# Patient Record
Sex: Female | Born: 1995 | Race: White | Hispanic: No | Marital: Married | Smoking: Never smoker
Health system: Southern US, Community
[De-identification: ages and names within clinical notes are randomized; demographics above are authoritative.]

## PROBLEM LIST (undated history)

## (undated) ENCOUNTER — Inpatient Hospital Stay (HOSPITAL_COMMUNITY): Payer: Self-pay

## (undated) DIAGNOSIS — N946 Dysmenorrhea, unspecified: Secondary | ICD-10-CM

## (undated) DIAGNOSIS — Z5189 Encounter for other specified aftercare: Secondary | ICD-10-CM

## (undated) DIAGNOSIS — J45909 Unspecified asthma, uncomplicated: Secondary | ICD-10-CM

## (undated) DIAGNOSIS — D649 Anemia, unspecified: Secondary | ICD-10-CM

## (undated) HISTORY — DX: Anemia, unspecified: D64.9

## (undated) HISTORY — PX: OTHER SURGICAL HISTORY: SHX169

## (undated) HISTORY — DX: Dysmenorrhea, unspecified: N94.6

## (undated) HISTORY — DX: Encounter for other specified aftercare: Z51.89

---
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
## 2015-03-16 ENCOUNTER — Encounter (HOSPITAL_COMMUNITY): Payer: Self-pay | Admitting: Emergency Medicine

## 2015-03-16 ENCOUNTER — Emergency Department (HOSPITAL_COMMUNITY)
Admission: EM | Admit: 2015-03-16 | Discharge: 2015-03-16 | Disposition: A | Discharge status: Home / Self Care | Payer: Medicaid Other | Attending: Emergency Medicine | Admitting: Emergency Medicine

## 2015-03-16 ENCOUNTER — Emergency Department (HOSPITAL_COMMUNITY): Payer: Medicaid Other

## 2015-03-16 DIAGNOSIS — Y9389 Activity, other specified: Secondary | ICD-10-CM | POA: Insufficient documentation

## 2015-03-16 DIAGNOSIS — Z793 Long term (current) use of hormonal contraceptives: Secondary | ICD-10-CM | POA: Diagnosis not present

## 2015-03-16 DIAGNOSIS — S60222A Contusion of left hand, initial encounter: Secondary | ICD-10-CM | POA: Insufficient documentation

## 2015-03-16 DIAGNOSIS — Y92830 Public park as the place of occurrence of the external cause: Secondary | ICD-10-CM | POA: Insufficient documentation

## 2015-03-16 DIAGNOSIS — Y998 Other external cause status: Secondary | ICD-10-CM | POA: Diagnosis not present

## 2015-03-16 DIAGNOSIS — S6992XA Unspecified injury of left wrist, hand and finger(s), initial encounter: Secondary | ICD-10-CM

## 2015-03-16 DIAGNOSIS — Z791 Long term (current) use of non-steroidal anti-inflammatories (NSAID): Secondary | ICD-10-CM | POA: Diagnosis not present

## 2015-03-16 DIAGNOSIS — W01198A Fall on same level from slipping, tripping and stumbling with subsequent striking against other object, initial encounter: Secondary | ICD-10-CM | POA: Insufficient documentation

## 2015-03-16 NOTE — ED Notes (Signed)
Patient fell on to her left hand yesterday  - patient states that when there is pressure applied to her left hand she had a lot of pain to the area. No noted deformity

## 2015-03-16 NOTE — ED Notes (Signed)
Patient transported to X-ray 

## 2015-03-16 NOTE — ED Notes (Signed)
Patient d/c';d self care.  F/U and home care reviewed.  Patient verbalized understanding.

## 2015-03-16 NOTE — ED Provider Notes (Signed)
CSN: [id number]     Arrival date & time 03/16/15  2024 History  By signing my name below, I, Emmanuella Mensah, attest that this documentation has been prepared under the direction and in the presence of Elpidio Anis, PA-C. Electronically Signed: Angelene Giovanni, ED Scribe. 03/16/2015. 9:12 PM.    Chief Complaint  Patient presents with  . Hand Injury    The history is provided by the patient. No language interpreter was used.   HPI Comments: Karen Yang is a 20 y.o. female who presents to the Emergency Department complaining of ongoing constant moderate pain to the palmar aspect to the left hand s/p fall that occurred yesterday. She reports associated mild discoloration to that area. She explains that she was helping her younger brother at the [facility] park yesterday when she fell, hitting her bilateral palm on the ground to break her fall. No alleviating factors noted. Pt has not tried any medications PTA. No lacerations, swelling, fever, or chills.    History reviewed. No pertinent past medical history. History reviewed. No pertinent past surgical history. History reviewed. No pertinent family history. Social History  Substance Use Topics  . Smoking status: Never Smoker   . Smokeless tobacco: None  . Alcohol Use: No   OB History    No data available     Review of Systems  Constitutional: Negative for fever and chills.  Musculoskeletal: Positive for arthralgias.  Skin: Positive for color change (mild). Negative for wound.      Allergies  Review of patient's allergies indicates no known allergies.  Home Medications   Prior to Admission medications   Medication Sig Start Date End Date Taking? Authorizing Provider  ibuprofen (ADVIL,MOTRIN) 800 MG tablet Take 1 tablet (800 mg total) by mouth 3 (three) times daily. 10/19/14   Emilia Beck, PA-C  Norgestimate-Ethinyl Estradiol Triphasic (TRINESSA, 28,) 0.18/0.215/0.25 MG-35 MCG tablet Take 1 tablet by mouth daily.     Historical Provider, MD   BP 113/52 mmHg  Pulse 75  Temp(Src) 98.2 F (36.8 C) (Oral)  Resp 13  Ht  (1.6 m)  Wt 105 lb (47.628 kg)  BMI 18.60 kg/m2  SpO2 100%  LMP 02/19/2015 Physical Exam  Constitutional: She is oriented to person, place, and time. She appears well-developed and well-nourished.  HENT:  Head: Normocephalic and atraumatic.  Cardiovascular: Normal rate.   Pulmonary/Chest: Effort normal.  Abdominal: She exhibits no distension.  Musculoskeletal:  Right hand without any bony deformity. Thenar ecchymosis without significant swelling. Tenderness localized to thenar area. FROM all joints of right hand and wrist.   Neurological: She is alert and oriented to person, place, and time.  Skin: Skin is warm and dry.  Psychiatric: She has a normal mood and affect.  Nursing note and vitals reviewed.   ED Course  Procedures (including critical care time) DIAGNOSTIC STUDIES: Oxygen Saturation is 100% on RA, normal by my interpretation.    COORDINATION OF CARE: 9:02 PM- Pt advised of plan for treatment and pt agrees.   Imaging Review No results found.   Elpidio Anis, PA-C has personally reviewed and evaluated these images as part of her medical decision-making.   MDM   Final diagnoses:  None    1. Left hand injury  Uncomplicated contusion injury to hand without physical exam deficits.   I personally performed the services described in this documentation, which was scribed in my presence. The recorded information has been reviewed and is accurate.     Elpidio Anis, PA-C 03/16/15 2158  Raeford Razor, MD 03/21/15 [phone]

## 2015-03-16 NOTE — ED Notes (Signed)
Patient returned from xray.

## 2015-03-16 NOTE — Discharge Instructions (Signed)
TAKE IBUPROFEN EVERY 4 HOURS FOR PAIN AND INFLAMMATION. ICE TO THE HAND FOR ANY SWELLING.   Contusion A contusion is a deep bruise. Contusions are the result of a blunt injury to tissues and muscle fibers under the skin. The injury causes bleeding under the skin. The skin overlying the contusion may turn blue, purple, or yellow. Minor injuries will give you a painless contusion, but more severe contusions may stay painful and swollen for a few weeks.  CAUSES  This condition is usually caused by a blow, trauma, or direct force to an area of the body. SYMPTOMS  Symptoms of this condition include:  Swelling of the injured area.  Pain and tenderness in the injured area.  Discoloration. The area may have redness and then turn blue, purple, or yellow. DIAGNOSIS  This condition is diagnosed based on a physical exam and medical history. An X-ray, CT scan, or MRI may be needed to determine if there are any associated injuries, such as broken bones (fractures). TREATMENT  Specific treatment for this condition depends on what area of the body was injured. In general, the best treatment for a contusion is resting, icing, applying pressure to (compression), and elevating the injured area. This is often called the RICE strategy. Over-the-counter anti-inflammatory medicines may also be recommended for pain control.  HOME CARE INSTRUCTIONS   Rest the injured area.  If directed, apply ice to the injured area:  Put ice in a plastic bag.  Place a towel between your skin and the bag.  Leave the ice on for 20 minutes, 2-3 times per day.  If directed, apply light compression to the injured area using an elastic bandage. Make sure the bandage is not wrapped too tightly. Remove and reapply the bandage as directed by your health care provider.  If possible, raise (elevate) the injured area above the level of your heart while you are sitting or lying down.  Take over-the-counter and prescription medicines  only as told by your health care provider. SEEK MEDICAL CARE IF:  Your symptoms do not improve after several days of treatment.  Your symptoms get worse.  You have difficulty moving the injured area. SEEK IMMEDIATE MEDICAL CARE IF:   You have severe pain.  You have numbness in a hand or foot.  Your hand or foot turns pale or cold.   This information is not intended to replace advice given to you by your health care provider. Make sure you discuss any questions you have with your health care provider.   Document Released: 10/22/2004 Document Revised: 10/03/2014 Document Reviewed: 05/30/2014 Elsevier Interactive Patient Education 2016 [employer] Cryotherapy Cryotherapy means treatment with cold. Ice or gel packs can be used to reduce both pain and swelling. Ice is the most helpful within the first 24 to 48 hours after an injury or flare-up from overusing a muscle or joint. Sprains, strains, spasms, burning pain, shooting pain, and aches can all be eased with ice. Ice can also be used when recovering from surgery. Ice is effective, has very few side effects, and is safe for most people to use. PRECAUTIONS  Ice is not a safe treatment option for people with:  Raynaud phenomenon. This is a condition affecting small blood vessels in the extremities. Exposure to cold may cause your problems to return.  Cold hypersensitivity. There are many forms of cold hypersensitivity, including:  Cold urticaria. Red, itchy hives appear on the skin when the tissues begin to warm after being iced.  Cold erythema.  This is a red, itchy rash caused by exposure to cold.  Cold hemoglobinuria. Red blood cells break down when the tissues begin to warm after being iced. The hemoglobin that carry oxygen are passed into the urine because they cannot combine with blood proteins fast enough.  Numbness or altered sensitivity in the area being iced. If you have any of the following conditions, do not use ice  until you have discussed cryotherapy with your caregiver:  Heart conditions, such as arrhythmia, angina, or chronic heart disease.  High blood pressure.  Healing wounds or open skin in the area being iced.  Current infections.  Rheumatoid arthritis.  Poor circulation.  Diabetes. Ice slows the blood flow in the region it is applied. This is beneficial when trying to stop inflamed tissues from spreading irritating chemicals to surrounding tissues. However, if you expose your skin to cold temperatures for too long or without the proper protection, you can damage your skin or nerves. Watch for signs of skin damage due to cold. HOME CARE INSTRUCTIONS Follow these tips to use ice and cold packs safely.  Place a dry or damp towel between the ice and skin. A damp towel will cool the skin more quickly, so you may need to shorten the time that the ice is used.  For a more rapid response, add gentle compression to the ice.  Ice for no more than 10 to 20 minutes at a time. The bonier the area you are icing, the less time it will take to get the benefits of ice.  Check your skin after 5 minutes to make sure there are no signs of a poor response to cold or skin damage.  Rest 20 minutes or more between uses.  Once your skin is numb, you can end your treatment. You can test numbness by very lightly touching your skin. The touch should be so light that you do not see the skin dimple from the pressure of your fingertip. When using ice, most people will feel these normal sensations in this order: cold, burning, aching, and numbness.  Do not use ice on someone who cannot communicate their responses to pain, such as small children or people with dementia. HOW TO MAKE AN ICE PACK Ice packs are the most common way to use ice therapy. Other methods include ice massage, ice baths, and cryosprays. Muscle creams that cause a cold, tingly feeling do not offer the same benefits that ice offers and should not be  used as a substitute unless recommended by your caregiver. To make an ice pack, do one of the following:  Place crushed ice or a bag of frozen vegetables in a sealable plastic bag. Squeeze out the excess air. Place this bag inside another plastic bag. Slide the bag into a pillowcase or place a damp towel between your skin and the bag.  Mix 3 parts water with 1 part rubbing alcohol. Freeze the mixture in a sealable plastic bag. When you remove the mixture from the freezer, it will be slushy. Squeeze out the excess air. Place this bag inside another plastic bag. Slide the bag into a pillowcase or place a damp towel between your skin and the bag. SEEK MEDICAL CARE IF:  You develop white spots on your skin. This may give the skin a blotchy (mottled) appearance.  Your skin turns blue or pale.  Your skin becomes waxy or hard.  Your swelling gets worse. MAKE SURE YOU:   Understand these instructions.  Will watch your  condition.  Will get help right away if you are not doing well or get worse.   This information is not intended to replace advice given to you by your health care provider. Make sure you discuss any questions you have with your health care provider.   Document Released: 09/08/2010 Document Revised: 02/02/2014 Document Reviewed: 09/08/2010 Elsevier Interactive Patient Education [employer]

## 2015-03-18 ENCOUNTER — Emergency Department (HOSPITAL_COMMUNITY)
Admission: EM | Admit: 2015-03-18 | Discharge: 2015-03-18 | Disposition: A | Discharge status: Home / Self Care | Payer: Medicaid Other | Attending: Emergency Medicine | Admitting: Emergency Medicine

## 2015-03-18 ENCOUNTER — Encounter (HOSPITAL_COMMUNITY): Payer: Self-pay | Admitting: Emergency Medicine

## 2015-03-18 DIAGNOSIS — R1110 Vomiting, unspecified: Secondary | ICD-10-CM | POA: Insufficient documentation

## 2015-03-18 DIAGNOSIS — Z0289 Encounter for other administrative examinations: Secondary | ICD-10-CM | POA: Insufficient documentation

## 2015-03-18 DIAGNOSIS — Z793 Long term (current) use of hormonal contraceptives: Secondary | ICD-10-CM | POA: Insufficient documentation

## 2015-03-18 DIAGNOSIS — Z791 Long term (current) use of non-steroidal anti-inflammatories (NSAID): Secondary | ICD-10-CM | POA: Diagnosis not present

## 2015-03-18 DIAGNOSIS — Z7689 Persons encountering health services in other specified circumstances: Secondary | ICD-10-CM

## 2015-03-18 NOTE — ED Notes (Signed)
Pt states that she drank too much caffeine this morning and threw up once. Work won't let her come back until she has a work note which is what she is here for. States she has had no vomiting since and feels well. Alert and oriented.

## 2015-03-18 NOTE — Discharge Instructions (Signed)
Please follow up with a primary care provider from the Resource Guide provided below in 4-5 days as needed. You may return to work tomorrow. Please return to the Emergency Department if symptoms worsen or new onset of fever, cough, difficulty breathing, chest pain, abdominal pain, nausea, vomiting, diarrhea, unable to tolerate fluids at home.   Emergency Department Resource Guide 1) Find a Doctor and Pay Out of Pocket Although you won't have to find out who is covered by your insurance plan, it is a good idea to ask around and get recommendations. You will then need to call the office and see if the doctor you have chosen will accept you as a new patient and what types of options they offer for patients who are self-pay. Some doctors offer discounts or will set up payment plans for their patients who do not have insurance, but you will need to ask so you aren't surprised when you get to your appointment.  2) Contact Your Local Health Department Not all health departments have doctors that can see patients for sick visits, but many do, so it is worth a call to see if yours does. If you don't know where your local health department is, you can check in your phone book. The CDC also has a tool to help you locate your state's health department, and many state websites also have listings of all of their local health departments.  3) Find a Walk-in Clinic If your illness is not likely to be very severe or complicated, you may want to try a walk in clinic. These are popping up all over the country in pharmacies, drugstores, and shopping centers. They're usually staffed by nurse practitioners or physician assistants that have been trained to treat common illnesses and complaints. They're usually fairly quick and inexpensive. However, if you have serious medical issues or chronic medical problems, these are probably not your best option.  No Primary Care Doctor: - Call Health Connect at  [phone] - they can  help you locate a primary care doctor that  accepts your insurance, provides certain services, etc. - Physician Referral Service- [phone]  Chronic Pain Problems: Organization         Address  Phone   Notes  [facility]  [phone] Patients need to be referred by their primary care doctor.   Medication Assistance: Organization         Address  Phone   Notes  [facility] Medication [facility] [address] [place] [place], [place] [address] [phone] --Must be a resident of [facility] -- Must have NO insurance coverage whatsoever (no Medicaid/ Medicare, etc.) -- The pt. MUST have a primary care doctor that directs their care regularly and follows them in the community   [facility]ist  [phone]   [employer]  [phone]    Agencies that provide inexpensive medical care: Organization         Address  Phone   Notes  [facility]  [phone]   [facility]    [phone]   [facility] [address] [place], [place] [address] [phone]   [facility] [address] [place], [place] [phone]   Planned Parenthood    [phone]   [facility]    [phone]   Community [facility] and [facility]  [address], [place] Phone:  [phone], Fax:  [phone]  Hours of Operation:  9 am - 6 pm, M-F.  Also accepts Medicaid/Medicare and self-pay.  [facility]  [address], [place] Phone: [phone](336) [phone], Fax: [phone]) [phone]. Hours of Operation:  8:30 am - 5:30 pm, M-F.  Also accepts Medicaid and self-pay.  [facility] [place] [address], [place] Phone: [phone]) [phone]   [facility] [address], [place], [place] [phone], Ext. [phone] Mondays & Thursdays: 7-9 AM.  First 15 patients are seen on a first come, first serve basis.    Medicaid-accepting [facility] Providers:  Organization         Address  Phone   Notes  [facility] [address], [place] [phone]) [phone] Also accepts self-pay patients.  [facility] [address], [place]  [phone]) [phone]   [facility] [address], [place] [phone]) [phone]   [facility] [address] [place] [address], [place] [phone]) [phone]   Renaye RakersVeita Bland [address], [place]   [phone] Only accepts [place] Access [place] patients after they have their name applied to their card.   Self-Pay (no insurance) in [facility]:  Organization         Address  Phone   Notes  Sickle Cell Patients, [facility] [address], [place] [phone]) [phone]   [facility] [address], [place] [phone]) [phone]   [facility]  [address], [place] [phone]) [phone]   [facility]/Dr. Osei-Bonsu  [address] [place] [address], [place] or [phone], [place] [phone]) [phone] Phone number for both [place] and [place] locations is the same.  [facility] [address], [place] [phone](336) [phone]   [facility] [place] [address] [place] [address], [place] or [address] [phone]) [phone]) [phone]   [facility] [address], [place] [phone]) [phone], phone; [phone](336) [phone], fax Sees patients 1st and 3rd Saturday of every month.  Must not qualify for public or private insurance (i.e. Medicaid, Medicare, [place] Health Choice, Veterans' Benefits)  Household income should be no more than 200% of the poverty level The clinic cannot treat you if you are pregnant or think you are pregnant  Sexually transmitted diseases are not treated at the clinic.    Dental Care: Organization         Address  Phone  Notes  [facility] Department of [facility]  [facility] [address], [place] [phone]) [phone] Accepts children up to age 20 who are enrolled in [place] or [place] Health Choice; pregnant women with a Medicaid card; and children who have applied for Medicaid or [place] Health Choice, but were declined, whose parents can pay a reduced fee at time of service.  [facility] of [facility] [place]  [address], [place] [phone]) [phone] Accepts children up to age 20 who are enrolled in [place] or [place] Health Choice; pregnant women with a Medicaid card; and children who have applied for Medicaid or [place] Health Choice, but were declined, whose parents can pay a reduced fee at time of service.  [facility]  [address], [place] [phone]) [phone] Patients are seen by appointment only. Walk-ins are not accepted. [facility] will  see patients 16 years of age and older. Monday - Tuesday (8am-5pm) Most Wednesdays (8:30-5pm) $30 per visit, cash only  [facility] Adult Dental Access PROGRAM  [address], [facility] [phone] Patients are seen by appointment only. Walk-ins are not accepted. [facility] will see patients 20 years of age and older. One Wednesday Evening (Monthly: Volunteer Based).  $30 per visit, cash only  [employer]  [phone] for adults; Children under age 46, call Graduate Pediatric Dentistry at [phone]. Children aged 62-14, please call [phone] to request a pediatric application.  Dental services are provided in all areas of dental care including fillings, crowns and bridges, complete and partial dentures, implants, gum treatment, root canals, and extractions. Preventive care is also provided. Treatment is provided to both adults and children. Patients are selected via a lottery and there is often a waiting list.   [facility] [address] [place]  [phone] [email or web address]   [facility] [address] [place], [place] [phone], Ext. [phone] Second and Fourth Thursday of each month, opens at 6:30 AM; Clinic ends at 9 AM.  Patients are seen on a first-come first-served basis, and a limited number are seen during each clinic.   [facility]  [address] Ether Griffins [place], [place] [phone]   Eligibility Requirements You must have lived in [place], [place], or [place] counties for at least the last three months.   You cannot be eligible for state or federal sponsored [place], including [employer], [place], or [employer].   You generally cannot be eligible for healthcare insurance through your employer.    How to apply: Eligibility screenings are held every Tuesday and Wednesday afternoon from 1:00 pm until 4:00 pm. You do not need an appointment for the interview!  [facility] [address] [place], [place] [phone]   [facility] Health Department  [phone]   [facility] Health Department  [phone]   [facility] Health Department  [phone]    Behavioral Health Resources in the Community: Intensive Outpatient Programs Organization         Address  Phone  Notes  [facility] [address], [place], [place] [phone]   [facility] Outpatient [address] [place], [place] [phone]   [facility]: [facility] [address] [place], [place]  [phone]   [facility] Mental Health [address],  [place], [place] [phone] or [phone]   Substance Abuse Resources Organization         Address  Phone  Notes  [facility] Drug Services  [phone]) [phone]   [facility]  [phone]   [facility] [place]  [phone]   Floydene Flock  [phone]   Residential & Outpatient Substance Abuse Program  [phone]   Psychological Services Organization         Address  Phone  Notes  [facility] Behavioral Health  [phone]   [facility]  [phone]   [facility] Mental Health [address] [place] [phone] or [phone]    Mobile Crisis Teams Organization         Address  Phone  Notes  [facility], Mobile Crisis Care Unit  [phone]   [facility]  [address] [place], [place] [phone]   [facility] [address] [place] [phone]    Self-Help/Support Groups Organization  Address  Phone             Notes  [facility]ental Health Assoc. of [place] - variety of support groups  336- [phone] Call for more information  Narcotics Anonymous (NA), [facility] [address], [place]  2 meetings at this location   Statisticianesidential Treatment Programs Organization         Address  Phone  Notes  [facility] [address],    [place]  [phone]   [facility]  [address], [place] [phone], [place], [place] [phone]   [facility] [address], [place] [phone] Admissions: 8am-3pm M-F  [facility] [address]    [place], [place] [phone]   [facility] [address], [place], [place] [phone]   [facility] [address]  [place], [place] [phone]   [facility] - Intensive Outpatient [address], [place], [place] [phone]   [facility] ([facility] [address]  [place], [place] [phone] or [phone]   [facility] ([facility]) [address] [place], [place] [phone] Accepts Medicaid  [facility] [address]  [place] [phone] Substance Abuse/Addiction Treatment   [facility] Behavioral Health Resources Organization         Address  Phone  Notes  [facility]  [phone](888) [phone]   Angie FavaJulie Brannon, PhD [address], [address] [place], [place]   [phone](336) [phone] or  [phone](336) [phone]   [facility]   [address] [place], [place] [phone](336) [phone]   [facility] [address], [place], [place] [phone](336) [phone] Insurance/Medicaid/sponsorship through [facility]  [facility] [address]                                    [place], [place] [phone](336) [phone] Therapy/tele-psych/case  [facility] [address]   [place], [place] [phone](336) [phone]    Dr. Lolly MustacheArfeen  [phone](336) [phone]   [facility]  [employer] [facility] 1) [address], [place] 2) [address], [place] 3)  [address], [place] [phone](336) [phone](336) [phone]  [phone](336) [phone]   [facility] Child Abuse Hotline [phone](336) [phone] or [phone](336) [phone] (After Hours)

## 2015-03-18 NOTE — ED Provider Notes (Signed)
CSN: [id number]     Arrival date & time 03/18/15  1718 History  By signing my name below, I, Gonzella Lex, attest that this documentation has been prepared under the direction and in the presence of Barrett Henle, PA-C. Electronically Signed: Gonzella Lex, Scribe. 03/18/2015. 7:57 PM.   Chief Complaint  Patient presents with  . Emesis    The history is provided by the patient. No language interpreter was used.    HPI Comments: Karen Yang is a 20 y.o. female who presents to the Emergency Department complaining of sudden onset of an episode of emesis while at school this morning after drinking too much caffeine. Pt currently feels well and has not vomited since this morning but is here to receive a work note because her job will not let her return without one. Pt denies fever, sore throat, wheezing, rhinorrhea, chest pain, abdominal pain, nausea, rash, urinary symptoms, and difficulty breathing. Pt is healthy otherwise.   History reviewed. No pertinent past medical history. History reviewed. No pertinent past surgical history. History reviewed. No pertinent family history. Social History  Substance Use Topics  . Smoking status: Never Smoker   . Smokeless tobacco: None  . Alcohol Use: No   OB History    No data available     Review of Systems  Constitutional: Negative for fever.  HENT: Negative for rhinorrhea and sore throat.   Respiratory: Negative for shortness of breath and wheezing.   Cardiovascular: Negative for chest pain.  Gastrointestinal: Positive for vomiting. Negative for nausea and abdominal pain.  Genitourinary: Negative for dysuria and difficulty urinating.  Skin: Negative for rash.   Allergies  Review of patient's allergies indicates no known allergies.  Home Medications   Prior to Admission medications   Medication Sig Start Date End Date Taking? Authorizing Provider  ibuprofen (ADVIL,MOTRIN) 800 MG tablet Take 1 tablet (800 mg  total) by mouth 3 (three) times daily. 10/19/14   Emilia Beck, PA-C  Norgestimate-Ethinyl Estradiol Triphasic (TRINESSA, 28,) 0.18/0.215/0.25 MG-35 MCG tablet Take 1 tablet by mouth daily.    Historical Provider, MD   BP 152/92 mmHg  Pulse 81  Temp(Src) 97.7 F (36.5 C) (Oral)  Resp 18  SpO2 100%  LMP 02/19/2015 Physical Exam  Constitutional: She is oriented to person, place, and time. She appears well-developed and well-nourished. No distress.  HENT:  Head: Normocephalic and atraumatic.  Nose: Nose normal. Right sinus exhibits no maxillary sinus tenderness and no frontal sinus tenderness. Left sinus exhibits no maxillary sinus tenderness and no frontal sinus tenderness.  Mouth/Throat: Oropharynx is clear and moist. No oropharyngeal exudate, posterior oropharyngeal edema, posterior oropharyngeal erythema or tonsillar abscesses.  Eyes: Conjunctivae and EOM are normal. Right eye exhibits no discharge. Left eye exhibits no discharge. No scleral icterus.  Neck: Normal range of motion. Neck supple.  Cardiovascular: Normal rate, regular rhythm, normal heart sounds and intact distal pulses.   Pulmonary/Chest: Effort normal and breath sounds normal. No respiratory distress. She has no wheezes. She has no rales. She exhibits no tenderness.  Abdominal: Soft. Bowel sounds are normal. She exhibits no distension and no mass. There is no tenderness. There is no rebound and no guarding.  Musculoskeletal: Normal range of motion. She exhibits no edema.  Lymphadenopathy:    She has no cervical adenopathy.  Neurological: She is alert and oriented to person, place, and time.  Skin: Skin is warm and dry.  Psychiatric: She has a normal mood and affect.  Nursing note and  vitals reviewed.   ED Course  Procedures  DIAGNOSTIC STUDIES:    Oxygen Saturation is 100% on RA, normal by my interpretation.   COORDINATION OF CARE:  7:41 PM Will discharge pt with doctor's note. Discussed treatment plan with  pt at bedside and pt agreed to plan.    MDM   Final diagnoses:  Return to work evaluation    Patient presents for a work for note. She notes she vomited once today while at school after drinking too much caffeine, denies any other complaints. VSS. Exam unremarkable. Patient reports she is not having any complaints at this time. Patient able to tolerate by mouth in the ED. Plan to discharge patient home with work note stating that she may return to work tomorrow. Patient given resource guide to follow up with PCP as needed.  Evaluation does not show pathology requring ongoing emergent intervention or admission. Pt is hemodynamically stable and mentating appropriately. Discussed findings/results and plan with patient/guardian, who agrees with plan. All questions answered. Return precautions discussed and outpatient follow up given.    I personally performed the services described in this documentation, which was scribed in my presence. The recorded information has been reviewed and is accurate.    Satira Sark [place], [place] 03/18/15 2009  Bethann Berkshire, MD 03/18/15 [phone]

## 2015-08-07 ENCOUNTER — Emergency Department: Payer: Medicaid Other

## 2015-08-07 ENCOUNTER — Emergency Department
Admission: EM | Admit: 2015-08-07 | Discharge: 2015-08-07 | Disposition: A | Discharge status: Home / Self Care | Payer: Medicaid Other | Attending: Emergency Medicine | Admitting: Emergency Medicine

## 2015-08-07 DIAGNOSIS — M778 Other enthesopathies, not elsewhere classified: Secondary | ICD-10-CM | POA: Diagnosis not present

## 2015-08-07 DIAGNOSIS — M25522 Pain in left elbow: Secondary | ICD-10-CM | POA: Diagnosis present

## 2015-08-07 MED ORDER — IBUPROFEN 400 MG PO TABS: 400.0000 mg | ORAL_TABLET | Freq: Four times a day (QID) | ORAL | Status: DC | PRN

## 2015-08-07 MED ORDER — IBUPROFEN 400 MG PO TABS
400.0000 mg | ORAL_TABLET | Freq: Once | ORAL | Status: AC
  Administered 2015-08-07: 400 mg via ORAL
  Filled 2015-08-07: qty 1

## 2015-08-07 NOTE — Discharge Instructions (Signed)
Tendinitis and Tenosynovitis  °Tendinitis is inflammation of the tendon. Tenosynovitis is inflammation of the lining around the tendon (tendon sheath). These painful conditions often occur at once. Tendons attach muscle to bone. To move a limb, force from the muscle moves through the tendon, to the bone. These conditions often cause increased pain when moving. Tendinitis may be caused by a small or partial tear in the tendon.  °SYMPTOMS  °· Pain, tenderness, redness, bruising, or swelling at the injury. °· Loss of normal joint movement. °· Pain that gets worse with use of the muscle and joint attached to the tendon. °· Weakness in the tendon, caused by calcium build up that may occur with tendinitis. °· Commonly affected tendons: °¨ Achilles tendon (calf of leg). °¨ Rotator cuff (shoulder joint). °¨ Patellar tendon (kneecap to shin). °¨ Peroneal tendon (ankle). °¨ Posterior tibial tendon (inner ankle). °¨ Biceps tendon (in front of shoulder). °CAUSES  °· Sudden strain on a flexed muscle, muscle overuse, sudden increase or change in activity, vigorous activity. °· Result of a direct hit (less common). °· Poor muscle action (biomechanics). °RISK INCREASES WITH: °· Injury (trauma). °· Too much exercise. °· Sudden change in athletic activity. °· Incorrect exercise form or technique. °· Poor strength and flexibility. °· Not warming-up properly before activity. °· Returning to activity before healing is complete. °PREVENTION  °· Warm-up and stretch properly before activity. °· Maintain physical fitness: °¨ Joint flexibility. °¨ Muscle strength and endurance. °¨ Fitness that increases heart rate. °· Learn and use proper exercise techniques. °· Use rehabilitation exercises to strengthen weak muscles and tendons. °· Ice the tendon after activity, to reduce recurring inflammation. °· Wear proper fitting protective equipment for specific tendons, when indicated. °PROGNOSIS  °When treated properly, can be cured in 6 to 8 weeks.  Recovery may take longer, depending on degree of injury.  °RELATED COMPLICATIONS  °· Re-injury or recurring symptoms. °· Permanent weakness or joint stiffness, if injury is severe and recovery is not completed. °· Delayed healing, if sports are started before healing is complete. °· Tearing apart (rupture) of the inflamed tendon. Tendinitis means the tendon is injured and must recover. °TREATMENT  °Treatment first involves ice, medicine, and rest from aggravating activities. This reduces pain and inflammation. Modifying your activity may be considered to prevent recurring injury. A brace, elastic bandage wrap, splint, cast, or sling may be prescribed to protect the joint for a short period. After that period, strengthening and stretching exercise may help to regain strength and full range of motion. If the condition persists, despite non-surgical treatment, surgery may be recommended to remove the inflamed tendon lining. Corticosteroid injections may be given to reduce inflammation. However, these injections may weaken the tendon and increase your risk for tendon rupture. °MEDICATION  °· If pain medicine is needed, nonsteroidal anti-inflammatory medicines (aspirin and ibuprofen), or other minor pain relievers (acetaminophen), are often recommended. °· Do not take pain medicine for 7 days before surgery. °· Prescription pain relievers are usually prescribed only after surgery. Use only as directed and only as much as you need. °· Ointments applied to the skin may be helpful. °· Corticosteroid injections may be given to reduce inflammation. However, this may increase your risk of a tendon rupture. °HEAT AND COLD °· Cold treatment (icing) relieves pain and reduces inflammation. Cold treatment should be applied for 10 to 15 minutes every 2 to 3 hours, and immediately after activity that aggravates your symptoms. Use ice packs or an ice massage. °· Heat   treatment may be used before performing stretching and strengthening  activities prescribed by your caregiver, physical therapist, or athletic trainer. Use a heat pack or a warm water soak. °SEEK MEDICAL CARE IF:  °· Symptoms get worse or do not improve, despite treatment. °· Pain becomes too much to tolerate. °· You develop numbness or tingling. °· Toes become cold, or toenails become blue, gray, or dark colored. °· New, unexplained symptoms develop. (Drugs used in treatment may produce side effects.) °  °This information is not intended to replace advice given to you by your health care provider. Make sure you discuss any questions you have with your health care provider. °  °Document Released: 01/12/2005 Document Revised: 04/06/2011 Document Reviewed: 04/26/2008 °Elsevier Interactive Patient Education ©2016 [employer] ° °
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
## 2015-08-07 NOTE — ED Provider Notes (Signed)
[facility] Emergency Department Provider Note ____________________________________________  Time seen: Approximately 8:47 PM  I have reviewed the triage vital signs and the nursing notes.   HISTORY  Chief Complaint Elbow Injury    HPI Karen Yang is a 20 y.o. female who presents to the emergency department for evaluation of left elbow pain. She states that she has had pain over the past couple hours due to repetitive motions while playing a video game. She denies falling or any impact to the joint. She has not taken anything for pain prior to arrival.  History reviewed. No pertinent past medical history.  There are no active problems to display for this patient.   Past Surgical History  Procedure Laterality Date  . Wisdom teeth removal      Current Outpatient Rx  Name  Route  Sig  Dispense  Refill  . ibuprofen (ADVIL,MOTRIN) 400 MG tablet   Oral   Take 1 tablet (400 mg total) by mouth every 6 (six) hours as needed.   30 tablet   0   . Norgestimate-Ethinyl Estradiol Triphasic (TRINESSA, 28,) 0.18/0.215/0.25 MG-35 MCG tablet   Oral   Take 1 tablet by mouth daily.           Allergies Review of patient's allergies indicates no known allergies.  No family history on file.  Social History Social History  Substance Use Topics  . Smoking status: Never Smoker   . Smokeless tobacco: None  . Alcohol Use: No    Review of Systems Constitutional: No recent illness. Cardiovascular: Denies chest pain or palpitations. Respiratory: Denies shortness of breath. Musculoskeletal: Pain in Left elbow Skin: Negative for rash, wound, lesion. Neurological: Negative for focal weakness or numbness.  ____________________________________________   PHYSICAL EXAM:  VITAL SIGNS: ED Triage Vitals  Enc Vitals Group     BP 08/07/15 1938 138/77 mmHg     Pulse Rate 08/07/15 1938 73     Resp 08/07/15 1938 16     Temp 08/07/15 1938 98.1 F (36.7 C)      Temp Source 08/07/15 1938 Oral     SpO2 08/07/15 1938 99 %     Weight 08/07/15 1938 98 lb (44.453 kg)     Height 08/07/15 1938  (1.6 m)     Head Cir --      Peak Flow --      Pain Score 08/07/15 1939 7     Pain Loc --      Pain Edu? --      Excl. in GC? --     Constitutional: Alert and oriented. Well appearing and in no acute distress. Eyes: Conjunctivae are normal. EOMI. Head: Atraumatic. Neck: No stridor.  Respiratory: Normal respiratory effort.   Musculoskeletal: Full range of motion of the left elbow. There is tenderness over the olecranon with palpation. There is no pain with full extension of the elbow or rotation of the wrist. Neurologic:  Normal speech and language. No gross focal neurologic deficits are appreciated. Speech is normal. No gait instability. Skin:  Skin is warm, dry and intact. Atraumatic. Psychiatric: Mood and affect are normal. Speech and behavior are normal.  ____________________________________________   LABS (all labs ordered are listed, but only abnormal results are displayed)  Labs Reviewed - No data to display ____________________________________________  RADIOLOGY  Left elbow negative for acute abnormality per radiology. ____________________________________________   PROCEDURES  Procedure(s) performed: None   ____________________________________________   INITIAL IMPRESSION / ASSESSMENT AND PLAN / ED COURSE  Pertinent labs & imaging results that were available during my care of the patient were reviewed by me and considered in my medical decision making (see chart for details).  Patient was encouraged to take ibuprofen as prescribed. She was encouraged to follow up with orthopedics for symptoms that are not improving over the next several days. ____________________________________________   FINAL CLINICAL IMPRESSION(S) / ED DIAGNOSES  Final diagnoses:  Left elbow tendinitis       Chinita PesterCari B Gee Habig, FNP 08/07/15  2051  Nita Sicklearolina Veronese, MD 08/07/15 2359  Nita Sicklearolina Veronese, MD 08/08/15 [phone]

## 2015-09-09 ENCOUNTER — Emergency Department (HOSPITAL_COMMUNITY)
Admission: EM | Admit: 2015-09-09 | Discharge: 2015-09-09 | Disposition: A | Discharge status: Home / Self Care | Payer: Medicaid Other | Attending: Emergency Medicine | Admitting: Emergency Medicine

## 2015-09-09 DIAGNOSIS — J029 Acute pharyngitis, unspecified: Secondary | ICD-10-CM | POA: Diagnosis not present

## 2015-09-09 LAB — RAPID STREP SCREEN (MED CTR MEBANE ONLY): STREPTOCOCCUS, GROUP A SCREEN (DIRECT): NEGATIVE

## 2015-09-09 MED ORDER — MAGIC MOUTHWASH W/LIDOCAINE: 5.0000 mL | Freq: Four times a day (QID) | ORAL | 0 refills | Status: DC | PRN

## 2015-09-09 MED ORDER — IBUPROFEN 400 MG PO TABS: 400.0000 mg | ORAL_TABLET | Freq: Four times a day (QID) | ORAL | 0 refills | Status: DC | PRN

## 2015-09-09 NOTE — ED Triage Notes (Signed)
Pt reports sore throat that started yesterday with off and on symptom of losing voice. Pt reports to using allergy pill with minimal relief and chloraseptic spray. No difficulty swallowing or speaking at this time. Able to maintain airway.

## 2015-09-09 NOTE — ED Provider Notes (Signed)
[facility]-EMERGENCY DEPT Provider Note   CSN: [id number] Arrival date & time: 09/09/15  2025  By signing my name below, I, Alyssa GroveMartin Green, attest that this documentation has been prepared under the direction and in the presence of TRW AutomotiveKelly Priscille Shadduck, PA-C. Electronically Signed: Alyssa GroveMartin Green, ED Scribe. 09/09/15. 10:45 PM.  History   Chief Complaint Chief Complaint  Patient presents with  . Sore Throat   The history is provided by the patient. No language interpreter was used.    HPI Comments: Karen Yang is a 20 y.o. female who presents to the Emergency Department complaining of gradual onset, constant sore throat onset yesterday. She states soreness began intermittently, but states today her sore throat became constant. Pt reports associated pain with swallowing. Pt states throat pain is exacerbated when talking. Pt took 2 chloraseptic lozenges and allergy pill with mild relief to symptoms. Pt denies sick contact. No congestion rhinorrhea, fever, vomiting.   No past medical history on file.  There are no active problems to display for this patient.   Past Surgical History:  Procedure Laterality Date  . wisdom teeth removal      OB History    No data available       Home Medications    Prior to Admission medications   Medication Sig Start Date End Date Taking? Authorizing Provider  ibuprofen (ADVIL,MOTRIN) 400 MG tablet Take 1 tablet (400 mg total) by mouth every 6 (six) hours as needed for mild pain or moderate pain. 09/09/15   Antony MaduraKelly Gweneth Fredlund, PA-C  magic mouthwash w/lidocaine SOLN Take 5 mLs by mouth 4 (four) times daily as needed (for sore throat). Gargle and swallow for sore throat 09/09/15   Antony MaduraKelly Tiernan Millikin, PA-C  Norgestimate-Ethinyl Estradiol Triphasic (TRINESSA, 28,) 0.18/0.215/0.25 MG-35 MCG tablet Take 1 tablet by mouth daily.    Historical Provider, MD    Family History No family history on file.  Social History Social History  Substance Use Topics  . Smoking status: Never  Smoker  . Smokeless tobacco: Not on file  . Alcohol use No     Allergies   Review of patient's allergies indicates no known allergies.   Review of Systems Review of Systems  HENT: Positive for sore throat.   10 Systems reviewed and are negative for acute change except as noted in the HPI.   Physical Exam Updated Vital Signs BP 150/75 (BP Location: Right Arm)   Pulse 78   Temp 98.6 F (37 C) (Oral)   Resp 16   Ht 5\' 3"  (1.6 m)   Wt 45.4 kg   LMP 08/25/2015   SpO2 100%   BMI 17.71 kg/m   Physical Exam  Constitutional: She is oriented to person, place, and time. She appears well-developed and well-nourished. No distress.  Nontoxic/nonseptic appearing  HENT:  Head: Normocephalic and atraumatic.  Right Ear: Hearing, tympanic membrane and external ear normal.  Left Ear: Hearing, tympanic membrane and external ear normal.  Oropharynx clear. Uvula midline. Patient tolerating secretions about difficulty. No significant erythema. No exudates or tonsillar enlargement.  Eyes: Conjunctivae and EOM are normal. No scleral icterus.  Neck: Normal range of motion.  No nuchal rigidity or meningismus  Pulmonary/Chest: Effort normal. No respiratory distress.  Respirations even and unlabored. No stridor.  Musculoskeletal: Normal range of motion.  Neurological: She is alert and oriented to person, place, and time.  Skin: Skin is warm and dry. No rash noted. She is not diaphoretic. No erythema. No pallor.  Psychiatric: She has a normal mood  and affect. Her behavior is normal.  Nursing note and vitals reviewed.    ED Treatments / Results  DIAGNOSTIC STUDIES: Oxygen Saturation is 100% on RA, normal by my interpretation.    COORDINATION OF CARE: 10:42 PM Discussed treatment plan with pt at bedside which includes Magic Mouth wash an Ibuprofen and pt agreed to plan.  Labs (all labs ordered are listed, but only abnormal results are displayed) Labs Reviewed  RAPID STREP SCREEN (NOT AT  [facility])  CULTURE, GROUP A STREP [facility])    EKG  EKG Interpretation None       Radiology No results found.  Procedures Procedures (including critical care time)  Medications Ordered in ED Medications - No data to display   Initial Impression / Assessment and Plan / ED Course  I have reviewed the triage vital signs and the nursing notes.  Pertinent labs & imaging results that were available during my care of the patient were reviewed by me and considered in my medical decision making (see chart for details).  Clinical Course    Pt afebrile without tonsillar exudate, negative strep. Presents with dysphagia; diagnosis of viral pharyngitis. No abx indicated. Plan to d/c with symptomatic tx for pain. Presentation non concerning for PTA or infxn spread to soft tissue. No trismus or uvula deviation. Specific return precautions discussed and provided. Patient discharged in satisfactory condition with no unaddressed concerns.   Final Clinical Impressions(s) / ED Diagnoses   Final diagnoses:  Sore throat  Acute pharyngitis, unspecified pharyngitis type    New Prescriptions Discharge Medication List as of 09/09/2015 10:46 PM    START taking these medications   Details  magic mouthwash w/lidocaine SOLN Take 5 mLs by mouth 4 (four) times daily as needed (for sore throat). Gargle and swallow for sore throat, Starting Mon 09/09/2015, Print       I personally performed the services described in this documentation, which was scribed in my presence. The recorded information has been reviewed and is accurate.       Antony MaduraKelly Eloina Ergle, PA-C 09/09/15 2334    Rolland PorterMark James, MD 09/10/15 [phone]

## 2015-09-12 LAB — CULTURE, GROUP A STREP (THRC)

## 2015-11-23 ENCOUNTER — Emergency Department
Admission: EM | Admit: 2015-11-23 | Discharge: 2015-11-23 | Disposition: A | Discharge status: Home / Self Care | Payer: Medicaid Other | Attending: Emergency Medicine | Admitting: Emergency Medicine

## 2015-11-23 ENCOUNTER — Emergency Department: Payer: Medicaid Other

## 2015-11-23 DIAGNOSIS — Y929 Unspecified place or not applicable: Secondary | ICD-10-CM | POA: Diagnosis not present

## 2015-11-23 DIAGNOSIS — Y998 Other external cause status: Secondary | ICD-10-CM | POA: Diagnosis not present

## 2015-11-23 DIAGNOSIS — S4992XA Unspecified injury of left shoulder and upper arm, initial encounter: Secondary | ICD-10-CM | POA: Diagnosis present

## 2015-11-23 DIAGNOSIS — S40012A Contusion of left shoulder, initial encounter: Secondary | ICD-10-CM | POA: Diagnosis not present

## 2015-11-23 DIAGNOSIS — Y9351 Activity, roller skating (inline) and skateboarding: Secondary | ICD-10-CM | POA: Diagnosis not present

## 2015-11-23 MED ORDER — NAPROXEN 500 MG PO TABS: 500.0000 mg | ORAL_TABLET | Freq: Two times a day (BID) | ORAL | 0 refills | Status: DC

## 2015-11-23 MED ORDER — TRAMADOL HCL 50 MG PO TABS: 50.0000 mg | ORAL_TABLET | Freq: Four times a day (QID) | ORAL | 0 refills | Status: DC | PRN

## 2015-11-23 NOTE — ED Triage Notes (Signed)
Fell last night while skating pain to left shoulder.

## 2015-11-23 NOTE — ED Provider Notes (Signed)
[facility] Emergency Department Provider Note   ____________________________________________   First MD Initiated Contact with Patient 11/23/15 1159     (approximate)  I have reviewed the triage vital signs and the nursing notes.   HISTORY  Chief Complaint Fall    HPI Karen Yang is a 20 y.o. female patient complaining of left shoulder pain secondary to a fall. Patient states she's rollerskating from mother last night when she fell landing on her left shoulder. Patient state pain increased with adduction and overheated reach his shoulder. No palliative measures taken for this complaint. Patient is right-hand dominant. Patient rates the pain as a 6/10. She describes pain as "achy".  No past medical history on file.  There are no active problems to display for this patient.   Past Surgical History:  Procedure Laterality Date  . wisdom teeth removal      Prior to Admission medications   Medication Sig Start Date End Date Taking? Authorizing Provider  ibuprofen (ADVIL,MOTRIN) 400 MG tablet Take 1 tablet (400 mg total) by mouth every 6 (six) hours as needed for mild pain or moderate pain. 09/09/15   Antony MaduraKelly Humes, PA-C  magic mouthwash w/lidocaine SOLN Take 5 mLs by mouth 4 (four) times daily as needed (for sore throat). Gargle and swallow for sore throat 09/09/15   Antony MaduraKelly Humes, PA-C  naproxen (NAPROSYN) 500 MG tablet Take 1 tablet (500 mg total) by mouth 2 (two) times daily with a meal. 11/23/15   Joni Reiningonald K Almin Livingstone, PA-C  Norgestimate-Ethinyl Estradiol Triphasic (TRINESSA, 28,) 0.18/0.215/0.25 MG-35 MCG tablet Take 1 tablet by mouth daily.    Historical Provider, MD  traMADol (ULTRAM) 50 MG tablet Take 1 tablet (50 mg total) by mouth every 6 (six) hours as needed for moderate pain. 11/23/15   Joni Reiningonald K Carri Spillers, PA-C    Allergies Review of patient's allergies indicates no known allergies.  No family history on file.  Social History Social History    Substance Use Topics  . Smoking status: Never Smoker  . Smokeless tobacco: Not on file  . Alcohol use No    Review of Systems Constitutional: No fever/chills Eyes: No visual changes. ENT: No sore throat. Cardiovascular: Denies chest pain. Respiratory: Denies shortness of breath. Gastrointestinal: No abdominal pain.  No nausea, no vomiting.  No diarrhea.  No constipation. Genitourinary: Negative for dysuria. Musculoskeletal: Left shoulder pain Skin: Negative for rash. Neurological: Negative for headaches, focal weakness or numbness.    ____________________________________________   PHYSICAL EXAM:  VITAL SIGNS: ED Triage Vitals  Enc Vitals Group     BP 11/23/15 1144 118/69     Pulse Rate 11/23/15 1144 62     Resp 11/23/15 1144 15     Temp 11/23/15 1144 98.8 F (37.1 C)     Temp Source 11/23/15 1144 Oral     SpO2 11/23/15 1144 100 %     Weight 11/23/15 1145 105 lb (47.6 kg)     Height 11/23/15 1145 5\' 3"  (1.6 m)     Head Circumference --      Peak Flow --      Pain Score 11/23/15 1151 6     Pain Loc --      Pain Edu? --      Excl. in GC? --     Constitutional: Alert and oriented. Well appearing and in no acute distress. Eyes: Conjunctivae are normal. PERRL. EOMI. Head: Atraumatic. Nose: No congestion/rhinnorhea. Mouth/Throat: Mucous membranes are moist.  Oropharynx non-erythematous. Neck: No stridor.  No cervical spine tenderness to palpation. Hematological/Lymphatic/Immunilogical: No cervical lymphadenopathy. Cardiovascular: Normal rate, regular rhythm. Grossly normal heart sounds.  Good peripheral circulation. Respiratory: Normal respiratory effort.  No retractions. Lungs CTAB. Gastrointestinal: Soft and nontender. No distention. No abdominal bruits. No CVA tenderness. Musculoskeletal: No obvious deformity to the left shoulder. Patient's whole hand and arm close to her body. Patient has decreased range of motion with abduction overhead reaching.  Neurologic:   Normal speech and language. No gross focal neurologic deficits are appreciated. No gait instability. Skin:  Skin is warm, dry and intact. No rash noted. Psychiatric: Mood and affect are normal. Speech and behavior are normal.  ____________________________________________   LABS (all labs ordered are listed, but only abnormal results are displayed)  Labs Reviewed - No data to display ____________________________________________  EKG   ____________________________________________  RADIOLOGY  No acute findings x-ray of the left shoulder. ____________________________________________   PROCEDURES  Procedure(s) performed: None  Procedures  Critical Care performed: No  ____________________________________________   INITIAL IMPRESSION / ASSESSMENT AND PLAN / ED COURSE  Pertinent labs & imaging results that were available during my care of the patient were reviewed by me and considered in my medical decision making (see chart for details).  Left shoulder contusion. Discussed x-ray finding with patient and mother. Patient given discharge Instructions. Patient given a prescription for tramadol and naproxen. Patient advised to follow orthopedics if condition persists.  Clinical Course   Patient placed in arm sling before departure.  ____________________________________________   FINAL CLINICAL IMPRESSION(S) / ED DIAGNOSES  Final diagnoses:  Contusion of left shoulder, initial encounter      NEW MEDICATIONS STARTED DURING THIS VISIT:  New Prescriptions   NAPROXEN (NAPROSYN) 500 MG TABLET    Take 1 tablet (500 mg total) by mouth 2 (two) times daily with a meal.   TRAMADOL (ULTRAM) 50 MG TABLET    Take 1 tablet (50 mg total) by mouth every 6 (six) hours as needed for moderate pain.     Note:  This document was prepared using Dragon voice recognition software and may include unintentional dictation errors.    Joni ReiningRonald K Jerrilynn Mikowski, PA-C 11/23/15 1327    Nita Sicklearolina  Veronese, MD 11/27/15 [phone]

## 2015-11-23 NOTE — ED Triage Notes (Signed)
States she fell while skating last pm  Landed on left shoulder

## 2015-11-23 NOTE — Discharge Instructions (Signed)
Wear sling for 2-3 days as needed °

## 2016-01-25 ENCOUNTER — Emergency Department
Admission: EM | Admit: 2016-01-25 | Discharge: 2016-01-25 | Disposition: A | Discharge status: Home / Self Care | Payer: Medicaid Other | Attending: Emergency Medicine | Admitting: Emergency Medicine

## 2016-01-25 ENCOUNTER — Emergency Department: Payer: Medicaid Other

## 2016-01-25 DIAGNOSIS — J069 Acute upper respiratory infection, unspecified: Secondary | ICD-10-CM | POA: Insufficient documentation

## 2016-01-25 DIAGNOSIS — B9789 Other viral agents as the cause of diseases classified elsewhere: Secondary | ICD-10-CM

## 2016-01-25 DIAGNOSIS — R05 Cough: Secondary | ICD-10-CM | POA: Diagnosis present

## 2016-01-25 MED ORDER — BENZONATATE 100 MG PO CAPS: 100.0000 mg | ORAL_CAPSULE | Freq: Three times a day (TID) | ORAL | 0 refills | Status: DC

## 2016-01-25 NOTE — ED Notes (Signed)
Pt alert and oriented X4, active, cooperative, pt in NAD. RR even and unlabored, color WNL.  Pt informed to return if any life threatening symptoms occur.   

## 2016-01-25 NOTE — ED Triage Notes (Signed)
Pt started with a sore throat a week ago, pt states that she has also been coughing a lot and feels like the sore throat has returned

## 2016-01-25 NOTE — ED Notes (Signed)
See triage note  States she developed sore throat and cough for couple of days

## 2016-01-25 NOTE — ED Provider Notes (Signed)
[facility] Emergency Department Provider Note  ____________________________________________  Time seen: Approximately 11:51 AM  I have reviewed the triage vital signs and the nursing notes.   HISTORY  Chief Complaint Cough    HPI Karen Yang is a 20 y.o. female presenting to the emergency department with nonproductive cough since 01/20/2016. Patient states that she has also had some mild pharyngitis. She denies fever, chest pain, chest tightness, fatigue, abdominal pain, nausea and vomiting. She denies dysuria, hematuria, diarrhea and constipation. She has been eating and drinking well. Patient has been afebrile. No recent travel. Patient currently works at [employer]. 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   No past medical history on file.  There are no active problems to display for this patient.   Past Surgical History:  Procedure Laterality Date  . wisdom teeth removal      Prior to Admission medications   Medication Sig Start Date End Date Taking? Authorizing Provider  benzonatate (TESSALON PERLES) 100 MG capsule Take 1 capsule (100 mg total) by mouth 3 (three) times daily. 01/25/16   Orvil FeilJaclyn M Bensyn Bornemann, PA-C  ibuprofen (ADVIL,MOTRIN) 400 MG tablet Take 1 tablet (400 mg total) by mouth every 6 (six) hours as needed for mild pain or moderate pain. 09/09/15   Antony MaduraKelly Humes, PA-C  magic mouthwash w/lidocaine SOLN Take 5 mLs by mouth 4 (four) times daily as needed (for sore throat). Gargle and swallow for sore throat 09/09/15   Antony MaduraKelly Humes, PA-C  naproxen (NAPROSYN) 500 MG tablet Take 1 tablet (500 mg total) by mouth 2 (two) times daily with a meal. 11/23/15   Joni Reiningonald K Smith, PA-C  Norgestimate-Ethinyl Estradiol Triphasic (TRINESSA, 28,) 0.18/0.215/0.25 MG-35 MCG tablet Take 1 tablet by mouth daily.    Historical Provider, MD  traMADol (ULTRAM) 50 MG tablet Take 1 tablet (50 mg total) by mouth every 6 (six) hours as needed for moderate  pain. 11/23/15   Joni Reiningonald K Smith, PA-C    Allergies Patient has no known allergies.  No family history on file.  Social History Social History  Substance Use Topics  . Smoking status: Never Smoker  . Smokeless tobacco: Not on file  . Alcohol use No     Review of Systems  Constitutional: No fever,  ENT: Has non-productive cough and mild pharyngitis.  Cardiovascular: no chest pain. Respiratory: No dyspnea.  Gastrointestinal: No abdominal pain.  No nausea, no vomiting.  No diarrhea.  No constipation. Genitourinary: Negative for dysuria. No hematuria Skin: Negative for rash and abrasions Neurological: Negative for headaches, focal weakness or numbness. 10-point ROS otherwise negative.  ____________________________________________   PHYSICAL EXAM:  VITAL SIGNS: ED Triage Vitals [01/25/16 1102]  Enc Vitals Group     BP 99/71     Pulse Rate 75     Resp 18     Temp 98.1 F (36.7 C)     Temp Source Oral     SpO2 100 %     Weight 100 lb (45.4 kg)     Height 5\' 3"  (1.6 m)     Head Circumference      Peak Flow      Pain Score 4     Pain Loc      Pain Edu?      Excl. in GC?      Constitutional: Alert and oriented. Well appearing and in no acute distress. Eyes: Conjunctivae are normal. PERRL. EOMI. Head: Atraumatic. ENT:      Ears: Tympanic  membranes are pearly bilaterally. No evidence of effusion or infection. Bony landmarks visualized bilaterally.      Nose: No congestion/rhinnorhea.      Mouth/Throat: Mucous membranes are moist. Posterior pharynx is mildly erythematous. Tonsils assessed at 1+. Uvula is midline. Hematological/Lymphatic/Immunilogical: No cervical lymphadenopathy. Cardiovascular: Normal rate, regular rhythm. No murmurs, gallops or rubs auscultated. Respiratory: Normal respiratory effort without tachypnea or retractions. Lungs CTAB. Good air entry to the bases with no decreased or absent breath sounds. Skin:  Skin is warm, dry and intact. No rash  noted. Psychiatric: Mood and affect are normal. Speech and behavior are normal. Patient exhibits appropriate insight and judgement.  _________________________________________   LABS (all labs ordered are listed, but only abnormal results are displayed)  Labs Reviewed - No data to display ____________________________________________  EKG   ____________________________________________  RADIOLOGY Geraldo PitterI, Daveyon Kitchings M Leea Rambeau, personally viewed and evaluated these images (plain radiographs) as part of my medical decision making, as well as reviewing the written report by the radiologist.  Dg Chest 2 View  Result Date: 01/25/2016 CLINICAL DATA:  Cough and upper chest pain since last week. EXAM: CHEST  2 VIEW COMPARISON:  None. FINDINGS: The heart size and mediastinal contours are within normal limits. There is no focal infiltrate, pulmonary edema, or pleural effusion. There is a small calcified granuloma in the right upper lobe. There is mild scoliosis of spine. IMPRESSION: No active cardiopulmonary disease. Electronically Signed   By: Sherian ReinWei-Chen  Lin M.D.   On: 01/25/2016 12:21    ____________________________________________    PROCEDURES  Procedure(s) performed:    Procedures    Medications - No data to display   ____________________________________________   INITIAL IMPRESSION / ASSESSMENT AND PLAN / ED COURSE  Pertinent labs & imaging results that were available during my care of the patient were reviewed by me and considered in my medical decision making (see chart for details).  Review of the [place] CSRS was performed in accordance of the [facility] prior to dispensing any controlled drugs.  Clinical Course    Assessment and plan: Viral Upper Respiratory Tract Infection Patient has experienced nonproductive cough for approximately 1 week and mild pharyngitis. DG chest conducted in the emergency department did not reveal consolidations or findings consistent with pneumonia. She has  been without fever, fatigue, purulent sputum production or shortness of breath. Viral upper respiratory tract infection is likely. There was a small calcified granuloma visualized on chest x-ray. Findings on chest x-ray were disclosed to the patient. Patient was advised to follow-up with her primary care provider. Patient was discharged with Tessalon pearls for cough. Vital signs are reassuring at this time. All patient questions were answered. ____________________________________________  FINAL CLINICAL IMPRESSION(S) / ED DIAGNOSES  Final diagnoses:  Viral URI with cough      NEW MEDICATIONS STARTED DURING THIS VISIT:  Discharge Medication List as of 01/25/2016  1:00 PM    START taking these medications   Details  benzonatate (TESSALON PERLES) 100 MG capsule Take 1 capsule (100 mg total) by mouth 3 (three) times daily., Starting Sat 01/25/2016, Print            This chart was dictated using voice recognition software/Dragon. Despite best efforts to proofread, errors can occur which can change the meaning. Any change was purely unintentional.    Orvil FeilJaclyn M Patrica Mendell, PA-C 01/25/16 1804    Governor Rooksebecca Lord, MD 01/26/16 [phone]

## 2016-04-20 ENCOUNTER — Encounter: Payer: Self-pay | Admitting: Emergency Medicine

## 2016-04-20 ENCOUNTER — Emergency Department
Admission: EM | Admit: 2016-04-20 | Discharge: 2016-04-20 | Disposition: A | Discharge status: Home / Self Care | Payer: Medicaid Other | Attending: Emergency Medicine | Admitting: Emergency Medicine

## 2016-04-20 DIAGNOSIS — M25562 Pain in left knee: Secondary | ICD-10-CM | POA: Insufficient documentation

## 2016-04-20 MED ORDER — MELOXICAM 7.5 MG PO TABS: 7.5000 mg | ORAL_TABLET | Freq: Every day | ORAL | 1 refills | Status: AC

## 2016-04-20 NOTE — ED Provider Notes (Signed)
[facility] Emergency Department Provider Note  ____________________________________________  Time seen: Approximately 6:40 PM  I have reviewed the triage vital signs and the nursing notes.   HISTORY  Chief Complaint Knee Pain    HPI Karen Yang is a 21 y.o. female presenting to the emergency department with 3 out of 10 left knee pain that started a week ago when patient had her knee flexed to 120. Patient states that when she straightened her left knee into extension, she experienced discomfort which has persisted. Patient denies prior falls or traumas to the left lower extremity. Patient is not currently participating in sports. She currently engages in a yoga class 1-2 days a week. Patient denies radiculopathy or weakness. No alleviating measures have been attempted.   History reviewed. No pertinent past medical history.  There are no active problems to display for this patient.   Past Surgical History:  Procedure Laterality Date  . wisdom teeth removal      Prior to Admission medications   Medication Sig Start Date End Date Taking? Authorizing Provider  benzonatate (TESSALON PERLES) 100 MG capsule Take 1 capsule (100 mg total) by mouth 3 (three) times daily. 01/25/16   Orvil FeilJaclyn M Fallon Haecker, PA-C  ibuprofen (ADVIL,MOTRIN) 400 MG tablet Take 1 tablet (400 mg total) by mouth every 6 (six) hours as needed for mild pain or moderate pain. 09/09/15   Antony MaduraKelly Humes, PA-C  magic mouthwash w/lidocaine SOLN Take 5 mLs by mouth 4 (four) times daily as needed (for sore throat). Gargle and swallow for sore throat 09/09/15   Antony MaduraKelly Humes, PA-C  naproxen (NAPROSYN) 500 MG tablet Take 1 tablet (500 mg total) by mouth 2 (two) times daily with a meal. 11/23/15   Joni Reiningonald K Smith, PA-C  Norgestimate-Ethinyl Estradiol Triphasic (TRINESSA, 28,) 0.18/0.215/0.25 MG-35 MCG tablet Take 1 tablet by mouth daily.    Historical Provider, MD  traMADol (ULTRAM) 50 MG tablet Take 1 tablet (50  mg total) by mouth every 6 (six) hours as needed for moderate pain. 11/23/15   Joni Reiningonald K Smith, PA-C    Allergies Patient has no known allergies.  No family history on file.  Social History Social History  Substance Use Topics  . Smoking status: Never Smoker  . Smokeless tobacco: Not on file  . Alcohol use No    Review of Systems  Constitutional: No fever/chills Eyes: No visual changes. No discharge ENT: No upper respiratory complaints. Cardiovascular: no chest pain. Respiratory: no cough. No SOB. Gastrointestinal: No abdominal pain.  No nausea, no vomiting.  No diarrhea.  No constipation. Musculoskeletal: Patient has left knee pain.  Skin: Negative for rash, abrasions, lacerations, ecchymosis. Neurological: Negative for headaches, focal weakness or numbness.  ____________________________________________   PHYSICAL EXAM:  VITAL SIGNS: ED Triage Vitals  Enc Vitals Group     BP 04/20/16 1740 135/75     Pulse Rate 04/20/16 1740 79     Resp 04/20/16 1740 20     Temp 04/20/16 1740 98.3 F (36.8 C)     Temp Source 04/20/16 1740 Oral     SpO2 04/20/16 1740 100 %     Weight 04/20/16 1740 100 lb (45.4 kg)     Height 04/20/16 1740 5\' 3"  (1.6 m)     Head Circumference --      Peak Flow --      Pain Score 04/20/16 1743 4     Pain Loc --      Pain Edu? --  Excl. in GC? --      Constitutional: Alert and oriented. Well appearing and in no acute distress. Eyes: Conjunctivae are normal. PERRL. EOMI. Head: Atraumatic. Cardiovascular: Normal rate, regular rhythm. Normal S1 and S2.  Good peripheral circulation. Respiratory: Normal respiratory effort without tachypnea or retractions. Lungs CTAB. Good air entry to the bases with no decreased or absent breath sounds. Musculoskeletal: Patient has 5 out of 5 strength in the lower extremities bilaterally. Patient has full range of motion at the hip, knee and ankle bilaterally and symmetrically. Left knee: Negative anterior and  posterior drawer tests. Negative ballottement. Negative apprehension test. Negative McMurray's. No laxity elicited with valgus and varus stress. No palpable crepitus with active flexion and extension at the knee. Palpable dorsalis pedis pulse bilaterally and symmetrically. Neurologic:  Normal speech and language. No gross focal neurologic deficits are appreciated. Reflexes are 2+ and symmetric in the lower extremities bilaterally. Skin:  Skin is warm, dry and intact. No rash noted. Psychiatric: Mood and affect are normal. Speech and behavior are normal. Patient exhibits appropriate insight and judgement.   ____________________________________________   LABS (all labs ordered are listed, but only abnormal results are displayed)  Labs Reviewed - No data to display ____________________________________________  EKG   ____________________________________________  RADIOLOGY  No results found.  ____________________________________________    PROCEDURES  Procedure(s) performed:    Procedures    Medications - No data to display   ____________________________________________   INITIAL IMPRESSION / ASSESSMENT AND PLAN / ED COURSE  Pertinent labs & imaging results that were available during my care of the patient were reviewed by me and considered in my medical decision making (see chart for details).  Review of the  CSRS was performed in accordance of the NCM[facility] prior to dispensing any controlled drugs.     Assessment and plan: Left Knee Pain  Patient presents to the emergency department with left knee pain for the past week. Patient denies falls or mechanisms of trauma. Patient denies a history of chronic knee pain. Patient currently rates her pain at 3 out of 10 in intensity. Due to mild pain and absence of trauma with reassuring physical exam, further workup with DG left knee is not warranted at this time. Patient was discharged with Mobic. A referral was given to  orthopedics, Dr. Joice Lofts. Vital signs are reassuring at this time. All patient questions were answered.   ____________________________________________  FINAL CLINICAL IMPRESSION(S) / ED DIAGNOSES  Final diagnoses:  None      NEW MEDICATIONS STARTED DURING THIS VISIT:  New Prescriptions   No medications on file        This chart was dictated using voice recognition software/Dragon. Despite best efforts to proofread, errors can occur which can change the meaning. Any change was purely unintentional.    Orvil Feil, PA-C 04/20/16 1859    Nita Sickle, MD 04/21/16 [phone]

## 2016-04-20 NOTE — ED Notes (Signed)
See triage note  Denies any injury to knee no swelling noted but ambulates with limp

## 2016-04-20 NOTE — ED Triage Notes (Signed)
One week ago was sitting with knee bent one week ago, pain since. Denies other injury. Pain L injury.

## 2016-05-07 ENCOUNTER — Encounter (HOSPITAL_COMMUNITY): Payer: Self-pay | Admitting: Emergency Medicine

## 2016-05-07 DIAGNOSIS — Y929 Unspecified place or not applicable: Secondary | ICD-10-CM | POA: Insufficient documentation

## 2016-05-07 DIAGNOSIS — Y990 Civilian activity done for income or pay: Secondary | ICD-10-CM | POA: Insufficient documentation

## 2016-05-07 DIAGNOSIS — Y9389 Activity, other specified: Secondary | ICD-10-CM | POA: Diagnosis not present

## 2016-05-07 DIAGNOSIS — S5002XA Contusion of left elbow, initial encounter: Secondary | ICD-10-CM | POA: Insufficient documentation

## 2016-05-07 DIAGNOSIS — W228XXA Striking against or struck by other objects, initial encounter: Secondary | ICD-10-CM | POA: Diagnosis not present

## 2016-05-07 DIAGNOSIS — S59902A Unspecified injury of left elbow, initial encounter: Secondary | ICD-10-CM | POA: Diagnosis present

## 2016-05-07 LAB — POC URINE PREG, ED: Preg Test, Ur: NEGATIVE

## 2016-05-07 NOTE — ED Triage Notes (Signed)
Patient was at work and was opening a box. Patient hit elbow on a metal box really hard. Patient is having pain in left elbow.

## 2016-05-08 ENCOUNTER — Emergency Department (HOSPITAL_COMMUNITY)
Admission: EM | Admit: 2016-05-08 | Discharge: 2016-05-08 | Disposition: A | Discharge status: Home / Self Care | Payer: Medicaid Other | Attending: Emergency Medicine | Admitting: Emergency Medicine

## 2016-05-08 ENCOUNTER — Emergency Department (HOSPITAL_COMMUNITY): Payer: Medicaid Other

## 2016-05-08 DIAGNOSIS — S5002XA Contusion of left elbow, initial encounter: Secondary | ICD-10-CM

## 2016-05-08 NOTE — Discharge Instructions (Signed)
Please read and follow all provided instructions.  Your diagnoses today include:  1. Contusion of left elbow, initial encounter     Tests performed today include:  An x-ray of the affected area - does NOT show any broken bones  Vital signs. See below for your results today.   Medications prescribed:   None  Take any prescribed medications only as directed.  Home care instructions:   Follow any educational materials contained in this packet  Follow R.I.C.E. Protocol:  R - rest your injury   I  - use ice on injury without applying directly to skin  C - compress injury with bandage or splint  E - elevate the injury as much as possible  Follow-up instructions: Please follow-up with your primary care provider if you continue to have significant pain in 1 week. In this case you may have a more severe injury that requires further care.   Return instructions:   Please return if your fingers are numb or tingling, appear gray or blue, or you have severe pain (also elevate the arm and loosen splint or wrap if you were given one)  Please return to the Emergency Department if you experience worsening symptoms.   Please return if you have any other emergent concerns.  Additional Information:  Your vital signs today were: BP (!) 145/81 (BP Location: Right Arm)    Pulse 95    Temp 98.1 F (36.7 C)    Resp 18    Ht  (1.6 m)    Wt 45.4 kg    LMP 04/19/2016 (Exact Date)    SpO2 99%    BMI 17.71 kg/m  If your blood pressure (BP) was elevated above 135/85 this visit, please have this repeated by your doctor within one month. --------------

## 2016-05-08 NOTE — ED Provider Notes (Signed)
[facility]-EMERGENCY DEPT Provider Note   CSN: [id number] Arrival date & time: 05/07/16  2324  By signing my name below, I, Linna Darner, attest that this documentation has been prepared under the direction and in the presence of [employer], PA-C. Electronically Signed: Linna Darner, Scribe. 05/08/2016. 1:02 AM.  History   Chief Complaint Chief Complaint  Patient presents with  . Elbow Injury    The history is provided by the patient. No language interpreter was used.     HPI Comments: Karen Yang is a 21 y.o. female who presents to the Emergency Department complaining of a left elbow injury sustained around 8:30 PM last night. She reports associated swelling. She states she was opening a heavily taped box at work and struck her left elbow against a metal door upon opening the box. Pt endorses pain exacerbation with flexion of her left elbow and applied pressure to her left elbow. No medications or treatments tried PTA. Pt denies numbness/tingling, wounds, or any other associated symptoms.  History reviewed. No pertinent past medical history.  There are no active problems to display for this patient.   Past Surgical History:  Procedure Laterality Date  . wisdom teeth removal      OB History    No data available       Home Medications    Prior to Admission medications   Medication Sig Start Date End Date Taking? Authorizing Provider  benzonatate (TESSALON PERLES) 100 MG capsule Take 1 capsule (100 mg total) by mouth 3 (three) times daily. 01/25/16   Orvil Feil, PA-C  ibuprofen (ADVIL,MOTRIN) 400 MG tablet Take 1 tablet (400 mg total) by mouth every 6 (six) hours as needed for mild pain or moderate pain. 09/09/15   Antony Madura, PA-C  magic mouthwash w/lidocaine SOLN Take 5 mLs by mouth 4 (four) times daily as needed (for sore throat). Gargle and swallow for sore throat 09/09/15   Antony Madura, PA-C  naproxen (NAPROSYN) 500 MG tablet Take 1 tablet (500 mg total) by mouth  2 (two) times daily with a meal. 11/23/15   Joni Reining, PA-C  Norgestimate-Ethinyl Estradiol Triphasic (TRINESSA, 28,) 0.18/0.215/0.25 MG-35 MCG tablet Take 1 tablet by mouth daily.    Historical Provider, MD  traMADol (ULTRAM) 50 MG tablet Take 1 tablet (50 mg total) by mouth every 6 (six) hours as needed for moderate pain. 11/23/15   Joni Reining, PA-C    Family History History reviewed. No pertinent family history.  Social History Social History  Substance Use Topics  . Smoking status: Never Smoker  . Smokeless tobacco: Never Used  . Alcohol use No     Allergies   Patient has no known allergies.   Review of Systems Review of Systems  Constitutional: Negative for activity change.  Musculoskeletal: Positive for arthralgias and joint swelling. Negative for back pain and neck pain.  Skin: Negative for wound.  Neurological: Negative for weakness and numbness.   Physical Exam Updated Vital Signs BP (!) 145/81 (BP Location: Right Arm)   Pulse 95   Temp 98.1 F (36.7 C)   Resp 18   Ht  (1.6 m)   Wt 100 lb (45.4 kg)   LMP 04/19/2016 (Exact Date)   SpO2 99%   BMI 17.71 kg/m   Physical Exam  Constitutional: She is oriented to person, place, and time. She appears well-developed and well-nourished. No distress.  HENT:  Head: Normocephalic and atraumatic.  Eyes: Conjunctivae and EOM are normal. Pupils are equal,  round, and reactive to light.  Neck: Normal range of motion. Neck supple. No tracheal deviation present.  Cardiovascular: Normal rate and normal pulses.  Exam reveals no decreased pulses.   Pulmonary/Chest: Effort normal. No respiratory distress.  Musculoskeletal: She exhibits tenderness. She exhibits no edema.       Left shoulder: Normal.       Left elbow: She exhibits decreased range of motion (slightly with flexion). She exhibits no swelling, no effusion and no deformity. Tenderness (mild over insertion of triceps tenderness) found.       Left wrist:  Normal.  Neurological: She is alert and oriented to person, place, and time. No sensory deficit.  Motor, sensation, and vascular distal to the injury is fully intact.   Skin: Skin is warm and dry.  Psychiatric: She has a normal mood and affect. Her behavior is normal.  Nursing note and vitals reviewed.  ED Treatments / Results  Labs (all labs ordered are listed, but only abnormal results are displayed) Labs Reviewed  POC URINE PREG, ED    Radiology Dg Elbow Complete Left  Result Date: 05/08/2016 CLINICAL DATA:  Left elbow pain. EXAM: LEFT ELBOW - COMPLETE 3+ VIEW COMPARISON:  None. FINDINGS: There is no evidence of fracture, dislocation, or joint effusion. There is no evidence of arthropathy or other focal bone abnormality. Soft tissues are unremarkable. IMPRESSION: Negative. Electronically Signed   By: Deatra Robinson M.D.   On: 05/08/2016 00:56    Procedures Procedures (including critical care time)  DIAGNOSTIC STUDIES: Oxygen Saturation is 99% on RA, normal by my interpretation.    COORDINATION OF CARE: 1:07 AM Discussed treatment plan with pt at bedside and pt agreed to plan.  Patient was counseled on RICE protocol and told to rest injury, use ice for no longer than 15 minutes every hour, compress the area, and elevate above the level of their heart as much as possible to reduce swelling. Questions answered. Patient verbalized understanding.    Encouraged PCP follow-up if not improved in one week for reevaluation.   Medications Ordered in ED Medications - No data to display   Initial Impression / Assessment and Plan / ED Course  I have reviewed the triage vital signs and the nursing notes.  Pertinent labs & imaging results that were available during my care of the patient were reviewed by me and considered in my medical decision making (see chart for details).     Vital signs reviewed and are as follows: Vitals:   05/07/16 2341  BP: (!) 145/81  Pulse: 95  Resp: 18    Temp: 98.1 F (36.7 C)     Final Clinical Impressions(s) / ED Diagnoses  Patient X-Ray negative for obvious fracture or dislocation.  Pt advised to follow up with orthopedics. Patient given sling while in ED, conservative therapy recommended and discussed. Patient will be discharged home & is agreeable with above plan. Returns precautions discussed. Pt appears safe for discharge.  Final diagnoses:  Contusion of left elbow, initial encounter    New Prescriptions New Prescriptions   No medications on file   I personally performed the services described in this documentation, which was scribed in my presence. The recorded information has been reviewed and is accurate.    Renne Crigler, PA-C 05/08/16 1914    Pricilla Loveless, MD 05/08/16 [phone]

## 2016-09-25 ENCOUNTER — Emergency Department (HOSPITAL_COMMUNITY)
Admission: EM | Admit: 2016-09-25 | Discharge: 2016-09-25 | Disposition: A | Discharge status: Home / Self Care | Payer: BLUE CROSS/BLUE SHIELD | Attending: Emergency Medicine | Admitting: Emergency Medicine

## 2016-09-25 ENCOUNTER — Encounter (HOSPITAL_COMMUNITY): Payer: Self-pay | Admitting: Emergency Medicine

## 2016-09-25 DIAGNOSIS — J069 Acute upper respiratory infection, unspecified: Secondary | ICD-10-CM | POA: Diagnosis not present

## 2016-09-25 DIAGNOSIS — R1084 Generalized abdominal pain: Secondary | ICD-10-CM | POA: Insufficient documentation

## 2016-09-25 DIAGNOSIS — R51 Headache: Secondary | ICD-10-CM | POA: Insufficient documentation

## 2016-09-25 DIAGNOSIS — R0981 Nasal congestion: Secondary | ICD-10-CM | POA: Diagnosis present

## 2016-09-25 DIAGNOSIS — R519 Headache, unspecified: Secondary | ICD-10-CM

## 2016-09-25 LAB — COMPREHENSIVE METABOLIC PANEL
ALT: 17 U/L (ref 14–54)
AST: 18 U/L (ref 15–41)
Albumin: 4.7 g/dL (ref 3.5–5.0)
Alkaline Phosphatase: 76 U/L (ref 38–126)
Anion gap: 10 (ref 5–15)
BILIRUBIN TOTAL: 0.6 mg/dL (ref 0.3–1.2)
BUN: 9 mg/dL (ref 6–20)
CHLORIDE: 107 mmol/L (ref 101–111)
CO2: 24 mmol/L (ref 22–32)
CREATININE: 0.54 mg/dL (ref 0.44–1.00)
Calcium: 9.6 mg/dL (ref 8.9–10.3)
GFR calc Af Amer: >60 mL/min (ref >60)
Glucose, Bld: 87 mg/dL (ref 65–99)
POTASSIUM: 4.2 mmol/L (ref 3.5–5.1)
Sodium: 141 mmol/L (ref 135–145)
TOTAL PROTEIN: 8.5 g/dL — AB (ref 6.5–8.1)

## 2016-09-25 LAB — URINALYSIS, ROUTINE W REFLEX MICROSCOPIC
Bilirubin Urine: NEGATIVE
GLUCOSE, UA: NEGATIVE mg/dL
HGB URINE DIPSTICK: NEGATIVE
Ketones, ur: 5 mg/dL — AB
LEUKOCYTES UA: NEGATIVE
Nitrite: NEGATIVE
PH: 6 (ref 5.0–8.0)
PROTEIN: NEGATIVE mg/dL
SPECIFIC GRAVITY, URINE: 1.015 (ref 1.005–1.030)

## 2016-09-25 LAB — CBC WITH DIFFERENTIAL/PLATELET
BASOS ABS: 0 10*3/uL (ref 0.0–0.1)
BASOS PCT: 0 %
Eosinophils Absolute: 0 10*3/uL (ref 0.0–0.7)
Eosinophils Relative: 0 %
HEMATOCRIT: 36.2 % (ref 36.0–46.0)
Hemoglobin: 11.5 g/dL — ABNORMAL LOW (ref 12.0–15.0)
LYMPHS PCT: 18 %
Lymphs Abs: 1.8 10*3/uL (ref 0.7–4.0)
MCH: 24.8 pg — ABNORMAL LOW (ref 26.0–34.0)
MCHC: 31.8 g/dL (ref 30.0–36.0)
MCV: 78 fL (ref 78.0–100.0)
Monocytes Absolute: 1.1 10*3/uL — ABNORMAL HIGH (ref 0.1–1.0)
Monocytes Relative: 11 %
NEUTROS PCT: 71 %
Neutro Abs: 7.3 10*3/uL (ref 1.7–7.7)
Platelets: 312 10*3/uL (ref 150–400)
RBC: 4.64 MIL/uL (ref 3.87–5.11)
RDW: 14.4 % (ref 11.5–15.5)
WBC: 10.2 10*3/uL (ref 4.0–10.5)

## 2016-09-25 LAB — I-STAT BETA HCG BLOOD, ED (MC, WL, AP ONLY): I-stat hCG, quantitative: <5 m[IU]/mL (ref <5)

## 2016-09-25 LAB — LIPASE, BLOOD: LIPASE: 24 U/L (ref 11–51)

## 2016-09-25 MED ORDER — SODIUM CHLORIDE 0.9 % IV BOLUS (SEPSIS)
1000.0000 mL | Freq: Once | INTRAVENOUS | Status: AC
  Administered 2016-09-25: 1000 mL via INTRAVENOUS

## 2016-09-25 MED ORDER — FLUTICASONE PROPIONATE 50 MCG/ACT NA SUSP: 1.0000 | Freq: Every day | NASAL | 2 refills | Status: DC

## 2016-09-25 MED ORDER — KETOROLAC TROMETHAMINE 30 MG/ML IJ SOLN
15.0000 mg | Freq: Once | INTRAMUSCULAR | Status: AC
  Administered 2016-09-25: 15 mg via INTRAVENOUS
  Filled 2016-09-25: qty 1

## 2016-09-25 NOTE — ED Provider Notes (Signed)
[facility]-EMERGENCY DEPT Provider Note   CSN: [id number] Arrival date & time: 09/25/16  1552     History   Chief Complaint Chief Complaint  Patient presents with  . Nasal Congestion  . Headache  . Abdominal Pain    HPI Karen Yang is a 21 y.o. female who presents with 1 day of nasal congestion, myalgias,  frontal headache, and abdominal pain. Patient reports that she first experienced nasal congestion, rhinorrhea, "throat irritation" last night. She states that this is not abnormal for her but it usually resolved by morning. Patient states that this morning the congestion had not improved. Patient also reports that she experienced a frontal headache. Patient states that it was gradual in nature and was not thunderclap. Patient does have a history of intermittent headache and states that this feels like previous headaches. She denies any history of trauma or fall.She has not taken any medications for the symptoms. Patient also reports having some generalized abdominal pain that began 1 hour prior to ED arrival. Patient reports decreased appetite but denies any nausea/vomiting. She also reports fatigue. She denies any increased lethargy or inability to arouse patient. Patient denies any fever, chills, chest pain, difficulty breathing, dysuria, hematuria, vaginal bleeding.   The history is provided by the patient.    History reviewed. No pertinent past medical history.  There are no active problems to display for this patient.   Past Surgical History:  Procedure Laterality Date  . wisdom teeth removal      OB History    No data available       Home Medications    Prior to Admission medications   Medication Sig Start Date End Date Taking? Authorizing Provider  acetaminophen (TYLENOL) 500 MG tablet Take 500 mg by mouth every 6 (six) hours as needed for moderate pain.   Yes [provider]  ibuprofen (ADVIL,MOTRIN) 200 MG tablet Take 200 mg by mouth every 6 (six) hours  as needed for moderate pain.   Yes [provider]  benzonatate (TESSALON PERLES) 100 MG capsule Take 1 capsule (100 mg total) by mouth 3 (three) times daily. Patient not taking: Reported on 09/25/2016 01/25/16   Orvil FeilWoods, Jaclyn M, PA-C  fluticasone [facility](FLONASE) 50 MCG/ACT nasal spray Place 1 spray into both nostrils daily. 09/25/16   Maxwell CaulLayden, Jarriel Papillion A, PA-C  ibuprofen (ADVIL,MOTRIN) 400 MG tablet Take 1 tablet (400 mg total) by mouth every 6 (six) hours as needed for mild pain or moderate pain. Patient not taking: Reported on 09/25/2016 09/09/15   Antony MaduraHumes, Kelly, PA-C  magic mouthwash w/lidocaine SOLN Take 5 mLs by mouth 4 (four) times daily as needed (for sore throat). Gargle and swallow for sore throat Patient not taking: Reported on 09/25/2016 09/09/15   Antony MaduraHumes, Kelly, PA-C  naproxen (NAPROSYN) 500 MG tablet Take 1 tablet (500 mg total) by mouth 2 (two) times daily with a meal. Patient not taking: Reported on 09/25/2016 11/23/15   Joni ReiningSmith, Ronald K, PA-C  traMADol (ULTRAM) 50 MG tablet Take 1 tablet (50 mg total) by mouth every 6 (six) hours as needed for moderate pain. Patient not taking: Reported on 09/25/2016 11/23/15   Joni ReiningSmith, Ronald K, PA-C    Family History History reviewed. No pertinent family history.  Social History Social History  Substance Use Topics  . Smoking status: Never Smoker  . Smokeless tobacco: Never Used  . Alcohol use No     Allergies   Patient has no known allergies.   Review of Systems Review of Systems  Constitutional: Positive for fatigue. Negative for chills.  HENT: Positive for congestion, rhinorrhea and sore throat.   Eyes: Negative for visual disturbance.  Respiratory: Negative for cough and shortness of breath.   Cardiovascular: Negative for chest pain.  Gastrointestinal: Positive for abdominal pain. Negative for nausea and vomiting.  Musculoskeletal: Positive for myalgias. Negative for back pain and neck pain.  Skin: Negative for rash.  Neurological:  Negative for dizziness, weakness and numbness.     Physical Exam Updated Vital Signs BP 119/66 (BP Location: Left Arm)   Pulse 76   Temp 98.2 F (36.8 C) (Oral)   Resp 12   Wt 45.4 kg (100 lb)   SpO2 97%   BMI 17.71 kg/m   Physical Exam  Constitutional: She is oriented to person, place, and time. She appears well-developed and well-nourished.  Sleeping comfortably on initial entrance into the room  HENT:  Head: Normocephalic and atraumatic.  Mouth/Throat: Oropharynx is clear and moist and mucous membranes are normal.  Uvula is midline. No evidence of trismus. No evidence of peritonsillar abscess. Posterior oropharynx is without any erythema, edema or exudate.  Eyes: Pupils are equal, round, and reactive to light. Conjunctivae, EOM and lids are normal.  Neck: Full passive range of motion without pain.  Full flexion/extension and lateral movement of neck fully intact. No bony midline tenderness. No deformities or crepitus.   Cardiovascular: Normal rate, regular rhythm, normal heart sounds and normal pulses.  Exam reveals no gallop and no friction rub.   No murmur heard. Pulmonary/Chest: Effort normal and breath sounds normal.  Abdominal: Soft. Normal appearance. There is generalized tenderness and tenderness in the periumbilical area. There is no rigidity, no guarding, no CVA tenderness, no tenderness at McBurney's point and negative Murphy's sign.  Abdomen soft, nondistended. She has some generalized tenderness around the periumbilical region. No McBurney's point tenderness. No Murphy sign. Negative psoas sign. Negative heel tap sign. No CVA tenderness bilaterally.  Musculoskeletal: Normal range of motion.  Neurological: She is alert and oriented to person, place, and time. GCS eye subscore is 4. GCS verbal subscore is 5. GCS motor subscore is 6.  Cranial nerves III-XII intact Follows commands, Moves all extremities  5/5 strength to BUE and BLE  Sensation intact throughout all  major nerve distributions Normal finger to nose. No dysdiadochokinesia. No pronator drift. No gait abnormalities  No slurred speech. No facial droop.  Easily aroused by verbal stimuli  Skin: Skin is warm and dry. Capillary refill takes less than 2 seconds.  Psychiatric: She has a normal mood and affect. Her speech is normal.  Nursing note and vitals reviewed.    ED Treatments / Results  Labs (all labs ordered are listed, but only abnormal results are displayed) Labs Reviewed  CBC WITH DIFFERENTIAL/PLATELET - Abnormal; Notable for the following:       Result Value   Hemoglobin 11.5 (*)    MCH 24.8 (*)    Monocytes Absolute 1.1 (*)    All other components within normal limits  COMPREHENSIVE METABOLIC PANEL - Abnormal; Notable for the following:    Total Protein 8.5 (*)    All other components within normal limits  URINALYSIS, ROUTINE W REFLEX MICROSCOPIC - Abnormal; Notable for the following:    Ketones, ur 5 (*)    All other components within normal limits  LIPASE, BLOOD  I-STAT BETA HCG BLOOD, ED ([facility], [facility], [facility] ONLY)    EKG  EKG Interpretation None       Radiology No  results found.  Procedures Procedures (including critical care time)  Medications Ordered in ED Medications  sodium chloride 0.9 % bolus 1,000 mL (0 mLs Intravenous Stopped 09/25/16 2025)  ketorolac (TORADOL) 30 MG/ML injection 15 mg (15 mg Intravenous Given 09/25/16 2025)     Initial Impression / Assessment and Plan / ED Course  I have reviewed the triage vital signs and the nursing notes.  Pertinent labs & imaging results that were available during my care of the patient were reviewed by me and considered in my medical decision making (see chart for details).     21 year old female who presents with nasal congestion, headache, abdominal pain, fatigue. Patient is afebrile, non-toxic appearing, sitting comfortably on examination table. Vital signs reviewed and stable. No neuro deficits noted on  exam. No indications for head CT at this time. Physical exam shows some mild periumbilical/generalized tenderness. Consider URI versus allergies versus viral syndrome. History/physical exam are not concerning for diverticulitis. Low suspicion for appendicitis given history/physical exam. Plan to check basic labs including CBC, CMP, UA, Beta Pregnancy. IVF given for fluid resuscitation. Analgesics provided in the department.  Labs and imaging reviewed. Lipase normal. Beta hCG is negative. CBC shows mild low hemoglobin. CMP is unremarkable. UA is negative for any acute signs of infection. Discussed results with patient. She reports improvement in headache and abdominal pain.   Patient ambulated in the department without any difficulty. Successful by mouth challenge in the department. Repeat abdominal exam shows improved abdominal tenderness. No peritoneal signs. Plan to treat symptomatically. Patient instructed follow-up with her primary care doctor next 24-48 hours for further evaluation. Strict return precautions discussed. Patient expresses understanding and agreement to plan.     Final Clinical Impressions(s) / ED Diagnoses   Final diagnoses:  Nasal congestion  Upper respiratory tract infection, unspecified type  Acute nonintractable headache, unspecified headache type    New Prescriptions New Prescriptions   FLUTICASONE (FLONASE) 50 MCG/ACT NASAL SPRAY    Place 1 spray into both nostrils daily.     Maxwell Caul, PA-C 09/27/16 1212    Cathren Laine, MD 09/27/16 1721

## 2016-09-25 NOTE — Discharge Instructions (Signed)
Use the Flonase as directed.  You can take Tylenol or ibuprofen as needed for the headache. Do not take any more ibuprofen tonight as the medication we gave you has ibuprofen and an.  Make sure you're saying hydrated and drink plenty of fluids.  Follow-up with her primary care doctor next 24-48 hours for further evaluation.  Return the emergency Department for any worsening headache, difficulty walking, numbness/weakness of her arms or legs, worsening cough, chest pain, difficulty breathing or any other worsening or concerning symptoms.

## 2016-09-25 NOTE — ED Triage Notes (Signed)
Pt c/o nasal congestion with clear rhinorrhea onset yesterday, headache, body aches, sore throat, fatigue onset today. No cough, emesis, diarrhea.

## 2016-12-03 ENCOUNTER — Encounter (HOSPITAL_COMMUNITY): Payer: Self-pay | Admitting: Emergency Medicine

## 2016-12-03 ENCOUNTER — Emergency Department (HOSPITAL_COMMUNITY)
Admission: EM | Admit: 2016-12-03 | Discharge: 2016-12-04 | Disposition: A | Discharge status: Home / Self Care | Payer: Worker's Compensation | Attending: Emergency Medicine | Admitting: Emergency Medicine

## 2016-12-03 DIAGNOSIS — Y9389 Activity, other specified: Secondary | ICD-10-CM | POA: Insufficient documentation

## 2016-12-03 DIAGNOSIS — Y990 Civilian activity done for income or pay: Secondary | ICD-10-CM | POA: Diagnosis not present

## 2016-12-03 DIAGNOSIS — S0990XA Unspecified injury of head, initial encounter: Secondary | ICD-10-CM | POA: Diagnosis not present

## 2016-12-03 DIAGNOSIS — Y92511 Restaurant or cafe as the place of occurrence of the external cause: Secondary | ICD-10-CM | POA: Insufficient documentation

## 2016-12-03 DIAGNOSIS — W228XXA Striking against or struck by other objects, initial encounter: Secondary | ICD-10-CM | POA: Insufficient documentation
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
## 2016-12-04 NOTE — Discharge Instructions (Signed)
Can take tylenol or motrin as needed for headache. Like we discussed, you may have minor concussion.  Make to get plenty of rest, try to limited TV/tablet/cell phone use, reading, etc that would strain eyes and make headache worse. Follow-up with your doctor if any ongoing issues. Return to the ED for new or worsening symptoms.

## 2017-02-11 ENCOUNTER — Encounter (HOSPITAL_COMMUNITY): Payer: Self-pay | Admitting: Emergency Medicine

## 2017-02-11 ENCOUNTER — Emergency Department (HOSPITAL_COMMUNITY): Payer: No Typology Code available for payment source

## 2017-02-11 ENCOUNTER — Emergency Department (HOSPITAL_COMMUNITY)
Admission: EM | Admit: 2017-02-11 | Discharge: 2017-02-11 | Disposition: A | Discharge status: Home / Self Care | Payer: No Typology Code available for payment source | Attending: Emergency Medicine | Admitting: Emergency Medicine

## 2017-02-11 DIAGNOSIS — S8001XA Contusion of right knee, initial encounter: Secondary | ICD-10-CM | POA: Insufficient documentation

## 2017-02-11 DIAGNOSIS — Y929 Unspecified place or not applicable: Secondary | ICD-10-CM | POA: Diagnosis not present

## 2017-02-11 DIAGNOSIS — T300 Burn of unspecified body region, unspecified degree: Secondary | ICD-10-CM

## 2017-02-11 DIAGNOSIS — Y9389 Activity, other specified: Secondary | ICD-10-CM | POA: Diagnosis not present

## 2017-02-11 DIAGNOSIS — Z79899 Other long term (current) drug therapy: Secondary | ICD-10-CM | POA: Diagnosis not present

## 2017-02-11 DIAGNOSIS — Y999 Unspecified external cause status: Secondary | ICD-10-CM | POA: Diagnosis not present

## 2017-02-11 DIAGNOSIS — S8391XA Sprain of unspecified site of right knee, initial encounter: Secondary | ICD-10-CM | POA: Insufficient documentation
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
## 2017-02-11 NOTE — ED Notes (Signed)
Patient transported to X-ray 
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
## 2017-06-08 ENCOUNTER — Ambulatory Visit: Payer: BLUE CROSS/BLUE SHIELD | Admitting: Obstetrics & Gynecology

## 2017-06-08 ENCOUNTER — Encounter: Payer: Self-pay | Admitting: Obstetrics & Gynecology

## 2017-06-08 ENCOUNTER — Other Ambulatory Visit: Payer: Self-pay

## 2017-06-08 ENCOUNTER — Other Ambulatory Visit (HOSPITAL_COMMUNITY)
Admission: RE | Admit: 2017-06-08 | Discharge: 2017-06-08 | Disposition: A | Discharge status: Home / Self Care | Payer: BLUE CROSS/BLUE SHIELD | Source: Ambulatory Visit | Attending: Obstetrics & Gynecology | Admitting: Obstetrics & Gynecology

## 2017-06-08 ENCOUNTER — Other Ambulatory Visit: Payer: Self-pay | Admitting: *Deleted

## 2017-06-08 VITALS — BP 108/56 | HR 80 | Resp 14 | Ht 63.5 in | Wt 103.2 lb

## 2017-06-08 DIAGNOSIS — R102 Pelvic and perineal pain: Secondary | ICD-10-CM | POA: Diagnosis not present

## 2017-06-08 DIAGNOSIS — Z01419 Encounter for gynecological examination (general) (routine) without abnormal findings: Secondary | ICD-10-CM

## 2017-06-08 DIAGNOSIS — N946 Dysmenorrhea, unspecified: Secondary | ICD-10-CM | POA: Diagnosis not present

## 2017-06-08 DIAGNOSIS — D500 Iron deficiency anemia secondary to blood loss (chronic): Secondary | ICD-10-CM | POA: Diagnosis not present

## 2017-06-08 DIAGNOSIS — D649 Anemia, unspecified: Secondary | ICD-10-CM | POA: Insufficient documentation

## 2017-06-08 DIAGNOSIS — Z124 Encounter for screening for malignant neoplasm of cervix: Secondary | ICD-10-CM

## 2017-06-08 NOTE — Progress Notes (Signed)
Patient scheduled while in office for PUS, transabdominal only, on 06/24/17 at 1:30pm, consult at 2pm with Dr. Hyacinth Meeker. Patient declined 5/23 due to school schedule. Advised to arrive with full bladder, drink 32 oz water 1 hour prior to appt. Patient aware will be called with benefits prior to appointment. Patient verbalizes understanding and is agreeable. Order placed for transabdominal US.

## 2017-06-08 NOTE — Progress Notes (Signed)
GYNECOLOGY  VISIT  CC:   New patient exam, pap, pelvic pain  HPI: 22 y.o. G0P0000 Single Caucasian female here as new patient for on and off sharp pelvic pain x 2 months.  Has been seeing provider at [facility], Dr. Lendell Caprice.  Was diagnosed with menorrhagia her cycles and anemia as well.  Flow was very heavy for first two to three days.  Was using heavy flow pads would need to change every two to three hours.  Never passed clots but she did have a lot of cramping.  Has pictures of tissue that she passed 06/06/17.  She did not know what this was.  She showed me these pictures today and it is endometrial.     First depo Provera was 04/21/17.  Didn't have a cycle for all of April.  Having spotting this week.  Does have follow-up scheduled   Never been SA.  GYNECOLOGIC HISTORY: Patient's last menstrual period was 06/02/2017 (approximate). Contraception: Depo provera. Last dose 04/21/17. Menopausal hormone therapy: none  There are no active problems to display for this patient.   Past Medical History:  Diagnosis Date  . Anemia   . Blood transfusion without reported diagnosis   . Dysmenorrhea   . Fibroid     Past Surgical History:  Procedure Laterality Date  . wisdom teeth removal      MEDS:   Current Outpatient Medications on File Prior to Visit  Medication Sig Dispense Refill  . Calcium Carbonate-Vit D-Min (CALCIUM 1200 PO) Take by mouth daily.    . ferrous sulfate (SLOW RELEASE IRON) 160 (50 Fe) MG TBCR SR tablet Take 1 tablet by mouth daily.    . medroxyPROGESTERone (DEPO-PROVERA) 150 MG/ML injection Inject 150 mg into the muscle every 3 (three) months.  0   No current facility-administered medications on file prior to visit.     ALLERGIES: Patient has no known allergies.  Family History  Problem Relation Age of Onset  . Thyroid disease Mother   . Ovarian cysts Sister     SH:  Single, non smoker  Review of Systems  Gastrointestinal: Positive for abdominal pain.  All  other systems reviewed and are negative.   PHYSICAL EXAMINATION:    BP (!) 108/56 (BP Location: Right Arm, Patient Position: Sitting, Cuff Size: Normal)   Pulse 80   Resp 14   Ht 5' 3.5" (1.613 m)   Wt 103 lb 3.2 oz (46.8 kg)   LMP 06/02/2017 (Approximate)   BMI 17.99 kg/m     General appearance: alert, cooperative and appears stated age Neck: no adenopathy, supple, symmetrical, trachea midline and thyroid normal to inspection and palpation CV:  Regular rate and rhythm Lungs:  clear to auscultation, no wheezes, rales or rhonchi, symmetric air entry Breasts: normal appearance, no masses or tenderness Abdomen: soft, non-tender; bowel sounds normal; no masses,  no organomegaly  Pelvic: External genitalia:  no lesions              Urethra:  normal appearing urethra with no masses, tenderness or lesions              Bartholins and Skenes: normal                 Vagina: normal appearing vagina with normal color and discharge, no lesions              Cervix: no lesions              Bimanual Exam:  Uterus:  normal  size, contour, position, consistency, mobility, non-tender              Adnexa: no mass, fullness, tenderness              Rectovaginal: Yes.  .  Confirms.              Anus:  normal sphincter tone, no lesions  Chaperone was present for exam.  Assessment: Pelvic pain with passage of endometrial tissue Recently started Depo Provera Concerns about hair loss with Depo Provera   Plan: Pap obtained today Return for PUS to further evaluate pelvic pain Has completed Gardisil vaccination

## 2017-06-10 LAB — CYTOLOGY - PAP: DIAGNOSIS: NEGATIVE

## 2017-06-24 ENCOUNTER — Ambulatory Visit (INDEPENDENT_AMBULATORY_CARE_PROVIDER_SITE_OTHER): Payer: BLUE CROSS/BLUE SHIELD

## 2017-06-24 ENCOUNTER — Other Ambulatory Visit: Payer: Self-pay

## 2017-06-24 ENCOUNTER — Ambulatory Visit (INDEPENDENT_AMBULATORY_CARE_PROVIDER_SITE_OTHER): Payer: BLUE CROSS/BLUE SHIELD | Admitting: Obstetrics & Gynecology

## 2017-06-24 VITALS — BP 116/66 | HR 64 | Resp 16 | Ht 63.5 in | Wt 106.0 lb

## 2017-06-24 DIAGNOSIS — N946 Dysmenorrhea, unspecified: Secondary | ICD-10-CM | POA: Diagnosis not present

## 2017-06-24 DIAGNOSIS — R102 Pelvic and perineal pain: Secondary | ICD-10-CM

## 2017-06-24 NOTE — Progress Notes (Signed)
22 y.o. G0P0000 Single Caucasian female here for pelvic ultrasound due to dysmenorrhea as well as recent hx of passage of likely endometrial tissue that was accompanied by significant cramping.  She has started on Depo Provera with [facility].  She is accompanied by her boyfriend today who has many questions.  These were addressed individually.  Patient's last menstrual period was 06/02/2017 (approximate).  Contraception: Depo Provera  Findings:  UTERUS: 7.2 x 4.9 x 2.6 cm.  No fibroids or other abnormalities noted. Endometrium: 1.4 mm ADNEXA: Left ovary: 2.1 x 1.3 x 1.4 cm with a 9 x 6 mm follicle       Right ovary: 3.2 x 1.6 x 1.0 cm CUL DE SAC: No free fluid noted  Discussion: Findings reviewed with patient.  She does understand that even with normal ultrasound results, endometriosis is still possibility.  She understands the definitive diagnosis of this disorder is made with laparoscopy.  At this time, I would not recommend that as she is just recently started Depo-Provera.  She is due her second injection in about 2 weeks.  She has done well from a bleeding standpoint until just recently when she started spotting.  I did discuss with the patient receiving the next injection a little early to see if this would control bleeding but she does not want to do this.  I did address her significant other's concerns about fertility issues and bone loss with the Depo-Provera.  We also discussed when appropriate time for the mortality BMD would be if she continues a Depo-Provera long-term.  Assessment:  Dysmenorrhea H/o pelvic pain On Depo provera  Plan:  For now, she is going to continue Depo Provera injections with [facility] and see how much improvement in bleeding and pain she gets with this method.  If needs to discuss additional options, she is aware she can return at any time.  Also, if pain persists and she would like to discussed laparoscopy, she knows to call for appt.     ~30 minutes spent with patient >50% of time was in face to face discussion of above.  Marland Kitchen

## 2017-06-27 ENCOUNTER — Encounter: Payer: Self-pay | Admitting: Obstetrics & Gynecology

## 2017-08-20 ENCOUNTER — Encounter: Payer: Self-pay | Admitting: Obstetrics & Gynecology

## 2017-10-05 ENCOUNTER — Emergency Department (HOSPITAL_COMMUNITY)
Admission: EM | Admit: 2017-10-05 | Discharge: 2017-10-05 | Disposition: A | Discharge status: Home / Self Care | Payer: BLUE CROSS/BLUE SHIELD | Attending: Emergency Medicine | Admitting: Emergency Medicine

## 2017-10-05 ENCOUNTER — Ambulatory Visit (HOSPITAL_COMMUNITY)
Admission: EM | Admit: 2017-10-05 | Discharge: 2017-10-05 | Discharge status: Absent without leave | Payer: BLUE CROSS/BLUE SHIELD | Source: Home / Self Care

## 2017-10-05 ENCOUNTER — Other Ambulatory Visit: Payer: Self-pay

## 2017-10-05 ENCOUNTER — Encounter (HOSPITAL_COMMUNITY): Payer: Self-pay | Admitting: *Deleted

## 2017-10-05 DIAGNOSIS — R040 Epistaxis: Secondary | ICD-10-CM | POA: Diagnosis not present

## 2017-10-05 DIAGNOSIS — R51 Headache: Secondary | ICD-10-CM | POA: Insufficient documentation

## 2017-10-05 DIAGNOSIS — Z79899 Other long term (current) drug therapy: Secondary | ICD-10-CM | POA: Insufficient documentation

## 2017-10-05 DIAGNOSIS — G44319 Acute post-traumatic headache, not intractable: Secondary | ICD-10-CM

## 2017-10-05 NOTE — ED Provider Notes (Signed)
[facility] EMERGENCY DEPARTMENT Provider Note   CSN: [id number] Arrival date & time: 10/05/17  1601     History   Chief Complaint Chief Complaint  Patient presents with  . Epistaxis    HPI Karen Yang is a 22 y.o. female for evaluation of epistaxis.  Patient states she has had 3 toes of epistaxis in the past 3 days.  She is not currently having any nosebleeding.  Patient states 2 days ago, she hit the top of her head on a bouncy capsule.  She denies loss of consciousness.  She reports a mild headache later that night, but no headache currently.  Additionally, she was on a roller coaster yesterday when she felt dizzy.  She has had no dizziness while not on the roller coaster.  She denies current headache, vision changes, slurred speech, decreased concentration, neck pain, back pain, nausea, vomiting, numbness, tingling, loss of bowel or bladder control.  When she has a headache, improves with Tylenol or ibuprofen.  She has no medical problems, takes no medications daily.  She has a history of nosebleeds, often happens when the seasons change over the area is dry.  She denies fevers, chills, ear pain, eye pain, nasal congestion, sore throat, cough.  She states she has not been drinking a lot of water, she is only drinking tea and sodas.  HPI  Past Medical History:  Diagnosis Date  . Anemia   . Blood transfusion without reported diagnosis    as an infant  . Dysmenorrhea     Patient Active Problem List   Diagnosis Date Noted  . Pelvic pain 06/08/2017  . Dysmenorrhea   . Anemia     Past Surgical History:  Procedure Laterality Date  . wisdom teeth removal       OB History    Gravida  0   Para  0   Term  0   Preterm  0   AB  0   Living  0     SAB  0   TAB  0   Ectopic  0   Multiple  0   Live Births  0            Home Medications    Prior to Admission medications   Medication Sig Start Date End Date Taking? Authorizing  Provider  Calcium Carbonate-Vit D-Min (CALCIUM 1200 PO) Take by mouth daily.    [provider]  ferrous sulfate (SLOW RELEASE IRON) 160 (50 Fe) MG TBCR SR tablet Take 1 tablet by mouth daily.    [provider]  medroxyPROGESTERone (DEPO-PROVERA) 150 MG/ML injection Inject 150 mg into the muscle every 3 (three) months. 04/21/17   [provider]    Family History Family History  Problem Relation Age of Onset  . Thyroid disease Mother   . Ovarian cysts Sister        half sister, aged 67 when cyst was removed    Social History Social History   Tobacco Use  . Smoking status: Never Smoker  . Smokeless tobacco: Never Used  Substance Use Topics  . Alcohol use: No  . Drug use: No     Allergies   Patient has no known allergies.   Review of Systems Review of Systems  HENT: Positive for nosebleeds (resolved).   Neurological: Positive for dizziness (when on a roller coaster) and headaches (resolved).  Hematological: Does not bruise/bleed easily.  All other systems reviewed and are negative.    Physical  Exam Updated Vital Signs BP 128/77 (BP Location: Right Arm)   Pulse 68   Temp 98.4 F (36.9 C) (Oral)   Resp 16   Ht 5\' 3"  (1.6 m)   Wt 49.4 kg   SpO2 99%   BMI 19.31 kg/m   Physical Exam  Constitutional: She is oriented to person, place, and time. She appears well-developed and well-nourished. No distress.  Sitting comfortably in the chair no acute distress  HENT:  Head: Normocephalic and atraumatic.  No current epistaxis.  No obvious hematoma, laceration, or injury.  No bleeding into the OP.  No nasal mucosal edema.  OP clear without tonsillar swelling or exudate.  Uvula midline with equal palate rise.  TMs nonerythematous and nonbulging bilaterally. No MM pallor  Eyes: Pupils are equal, round, and reactive to light. Conjunctivae and EOM are normal.  Neck: Normal range of motion. Neck supple.  Cardiovascular: Normal rate, regular rhythm and  intact distal pulses.  Pulmonary/Chest: Effort normal and breath sounds normal. No respiratory distress. She has no wheezes.  Abdominal: Soft. She exhibits no distension. There is no tenderness.  Musculoskeletal: Normal range of motion.  Neurological: She is alert and oriented to person, place, and time. She displays normal reflexes. No cranial nerve deficit or sensory deficit. Coordination normal.  No obvious neurologic deficits.  CN intact.  Nose to finger intact.  Grip strength intact.  Fine movement and coordination intact.  Strength intact x4.  Sensation intact x4.  Radial pedal pulses intact bilaterally.  Skin: Skin is warm and dry. Capillary refill takes less than 2 seconds. No pallor.  Psychiatric: She has a normal mood and affect.  Nursing note and vitals reviewed.    ED Treatments / Results  Labs (all labs ordered are listed, but only abnormal results are displayed) Labs Reviewed - No data to display  EKG None  Radiology No results found.  Procedures Procedures (including critical care time)  Medications Ordered in ED Medications - No data to display   Initial Impression / Assessment and Plan / ED Course  I have reviewed the triage vital signs and the nursing notes.  Pertinent labs & imaging results that were available during my care of the patient were reviewed by me and considered in my medical decision making (see chart for details).     Patient presenting for evaluation of multiple nosebleeds and a recent head injury.  Physical exam reassuring, no current nose bleeding and no neurologic deficits.  Patient hit the top of her head on a bouncy castle several days ago, has had intermittent headaches but none currently.  Doubt concerning intracranial pathology including ICH, fracture, or edema.  I do not believe CT is necessary.  Itchiness only present during a roller coaster ride, no dizziness other times.  Doubt this is related to patient's head injury or nosebleeding.   Consider possible post traumatic headaches.  Also, consider dehydration, as patient is having nosebleeds and states she has not been drinking a lot of water.  No current nosebleeds, patient has history of the same.  Discussed follow-up with ENT/PCP if nosebleeds continue to occur frequently.  Discussed option of using humidifier and importance of hydration.  At this time, patient appears safe for discharge.  Return precautions given.  Patient states she understands and agrees plan.   Final Clinical Impressions(s) / ED Diagnoses   Final diagnoses:  Epistaxis  Acute post-traumatic headache, not intractable    ED Discharge Orders    None  Alveria Apley, PA-C 10/05/17 1400    Vanetta Mulders, MD 10/10/17 [phone]

## 2017-10-05 NOTE — ED Triage Notes (Signed)
Pt in c/o nosebleed, no active bleeding at this time, happened twice this weekend and again this morning, pt also reports she hit her head on Saturday, denies LOC, wants to make sure the symptoms are not related, no distress noted, denies headache

## 2017-10-05 NOTE — Discharge Instructions (Signed)
It is very important that you are staying well-hydrated with water.  Your urine should be clear to pale yellow. Start using a humidifier, especially at night as needed for continued nosebleeds. Follow-up with Dr. Katrinka Blazing if you have persistent headaches over the next several weeks.  In the meantime, use Tylenol or ibuprofen as needed for pain. Return to the emergency room if you develop severe headache, vision changes, persistent bleeding that will not stop, persistent dizziness, vomiting, or any new or concerning symptoms.

## 2017-11-07 ENCOUNTER — Emergency Department (HOSPITAL_COMMUNITY)
Admission: EM | Admit: 2017-11-07 | Discharge: 2017-11-07 | Disposition: A | Discharge status: Home / Self Care | Payer: BLUE CROSS/BLUE SHIELD | Attending: Emergency Medicine | Admitting: Emergency Medicine

## 2017-11-07 ENCOUNTER — Other Ambulatory Visit: Payer: Self-pay

## 2017-11-07 ENCOUNTER — Encounter (HOSPITAL_COMMUNITY): Payer: Self-pay | Admitting: *Deleted

## 2017-11-07 ENCOUNTER — Emergency Department (HOSPITAL_COMMUNITY): Payer: BLUE CROSS/BLUE SHIELD

## 2017-11-07 DIAGNOSIS — R0602 Shortness of breath: Secondary | ICD-10-CM | POA: Diagnosis not present

## 2017-11-07 DIAGNOSIS — Z79899 Other long term (current) drug therapy: Secondary | ICD-10-CM | POA: Insufficient documentation

## 2017-11-07 LAB — BASIC METABOLIC PANEL WITH GFR
Anion gap: 8 (ref 5–15)
BUN: 6 mg/dL (ref 6–20)
CO2: 19 mmol/L — ABNORMAL LOW (ref 22–32)
Calcium: 9.3 mg/dL (ref 8.9–10.3)
Chloride: 111 mmol/L (ref 98–111)
Creatinine, Ser: 0.61 mg/dL (ref 0.44–1.00)
GFR calc Af Amer: >60 mL/min
GFR calc non Af Amer: >60 mL/min
Glucose, Bld: 91 mg/dL (ref 70–99)
Potassium: 4.1 mmol/L (ref 3.5–5.1)
Sodium: 138 mmol/L (ref 135–145)

## 2017-11-07 LAB — CBC
HCT: 44.4 % (ref 36.0–46.0)
Hemoglobin: 15 g/dL (ref 12.0–15.0)
MCH: 29.6 pg (ref 26.0–34.0)
MCHC: 33.8 g/dL (ref 30.0–36.0)
MCV: 87.7 fL (ref 80.0–100.0)
Platelets: 260 K/uL (ref 150–400)
RBC: 5.06 MIL/uL (ref 3.87–5.11)
RDW: 12.8 % (ref 11.5–15.5)
WBC: 5.4 K/uL (ref 4.0–10.5)
nRBC: 0 % (ref 0.0–0.2)

## 2017-11-07 LAB — I-STAT TROPONIN, ED: Troponin i, poc: 0 ng/mL (ref 0.00–0.08)

## 2017-11-07 LAB — I-STAT BETA HCG BLOOD, ED (MC, WL, AP ONLY): I-stat hCG, quantitative: <5 m[IU]/mL (ref <5)

## 2017-11-07 NOTE — ED Provider Notes (Addendum)
[facility] EMERGENCY DEPARTMENT Provider Note   CSN: [id number] Arrival date & time: 11/07/17  1325     History   Chief Complaint Chief Complaint  Patient presents with  . Shortness of Breath    HPI Karen Yang is a 22 y.o. female.  22 y/o female with a PMH of anemia presents to the ED with a chief complaint of shortness of breath.  She reports she was sitting in her car yesterday talking on the phone when she started to feel short of breath she reports this episode lasted for 30 minutes as she continued to talk on the phone.  She reports a second episode this morning when she was driving she felt short of breath and lasted for another 30 minutes.  Patient reports she is had shortness of breath before but this usually happens whenever she exercises or exerts herself.  She has not tried any medical therapy for her relief in symptoms.  She was told she had anemia and was placed on Depakote shot which has helped with her anemia.  She denies any fever, URI symptoms, chest pain, abdominal pain or back pain.      Past Medical History:  Diagnosis Date  . Anemia   . Blood transfusion without reported diagnosis    as an infant  . Dysmenorrhea     Patient Active Problem List   Diagnosis Date Noted  . Pelvic pain 06/08/2017  . Dysmenorrhea   . Anemia     Past Surgical History:  Procedure Laterality Date  . wisdom teeth removal       OB History    Gravida  0   Para  0   Term  0   Preterm  0   AB  0   Living  0     SAB  0   TAB  0   Ectopic  0   Multiple  0   Live Births  0            Home Medications    Prior to Admission medications   Medication Sig Start Date End Date Taking? Authorizing Provider  Calcium Carbonate-Vit D-Min (CALCIUM 1200 PO) Take by mouth daily.    [provider]  ferrous sulfate (SLOW RELEASE IRON) 160 (50 Fe) MG TBCR SR tablet Take 1 tablet by mouth daily.    [provider]    medroxyPROGESTERone (DEPO-PROVERA) 150 MG/ML injection Inject 150 mg into the muscle every 3 (three) months. 04/21/17   [provider]    Family History Family History  Problem Relation Age of Onset  . Thyroid disease Mother   . Ovarian cysts Sister        half sister, aged 60 when cyst was removed    Social History Social History   Tobacco Use  . Smoking status: Never Smoker  . Smokeless tobacco: Never Used  Substance Use Topics  . Alcohol use: No  . Drug use: No     Allergies   Patient has no known allergies.   Review of Systems Review of Systems  Constitutional: Negative for chills and fever.  HENT: Negative for rhinorrhea and sore throat.   Respiratory: Positive for shortness of breath. Negative for cough.   Cardiovascular: Negative for chest pain and palpitations.  Gastrointestinal: Negative for abdominal pain, diarrhea, nausea and vomiting.  Genitourinary: Negative for dysuria and flank pain.  Musculoskeletal: Negative for back pain.  Skin: Negative for pallor and wound.  Neurological: Negative for  syncope, light-headedness and headaches.  All other systems reviewed and are negative.    Physical Exam Updated Vital Signs BP 124/76 (BP Location: Right Arm)   Pulse 66   Temp 98.3 F (36.8 C) (Oral)   Resp 16   SpO2 100%   Physical Exam  Constitutional: She is oriented to person, place, and time. She appears well-developed and well-nourished.  HENT:  Head: Normocephalic and atraumatic.  Cardiovascular: Normal rate.  Pulmonary/Chest: Effort normal. No accessory muscle usage. No respiratory distress. She has no decreased breath sounds. She has no wheezes. She exhibits no tenderness.  Abdominal: Soft. Bowel sounds are normal. There is no tenderness.  Musculoskeletal:       Right lower leg: She exhibits no edema.       Left lower leg: She exhibits no edema.  Neurological: She is alert and oriented to person, place, and time.  Skin: Skin is warm  and dry.  Nursing note and vitals reviewed.    ED Treatments / Results  Labs (all labs ordered are listed, but only abnormal results are displayed) Labs Reviewed  BASIC METABOLIC PANEL - Abnormal; Notable for the following components:      Result Value   CO2 19 (*)    All other components within normal limits  CBC  I-STAT TROPONIN, ED  I-STAT BETA HCG BLOOD, ED ([facility], [facility], [facility] ONLY)    EKG None  Radiology Dg Chest 2 View  Result Date: 11/07/2017 CLINICAL DATA:  Shortness of breath EXAM: CHEST - 2 VIEW COMPARISON:  01/25/2016 FINDINGS: The heart size and mediastinal contours are within normal limits. Both lungs are clear. The visualized skeletal structures are unremarkable. IMPRESSION: No active cardiopulmonary disease. Electronically Signed   By: Alcide Clever M.D.   On: 11/07/2017 14:38    Procedures Procedures (including critical care time)  Medications Ordered in ED Medications - No data to display   Initial Impression / Assessment and Plan / ED Course  I have reviewed the triage vital signs and the nursing notes.  Pertinent labs & imaging results that were available during my care of the patient were reviewed by me and considered in my medical decision making (see chart for details).   Patient presents with two episodes of shortness of breath since yesterday.  Reports no previous medical history aside from anemia which she is currently on iron supplements. 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  DG chest 2 view showed showed no active cardiopulmonary disease.  I have discussed these results with patient in the room and shown her her x-ray.  Patient reports her mother, brother, grandmother all have a history of asthma but she is never been diagnosed with asthma and has no inhaler at home.   Up in the ED has been unremarkable BMP showed no electrolyte dysfunction, creatinine level is stable.  CBC showed no leukocytosis,  hemoglobin is 15.0.  Troponin was negative.EKG NSR.    I have advised patient she needs to follow-up with her primary care physician who can further evaluate her need for an inhaler along with asthma diagnosis.  Patient reports no URI symptoms, fever in the last couple weeks.  She is vitals stable during ED visit,no tachycardia or hypotension. PERC negative.Return precautions discussed.   Final Clinical Impressions(s) / ED Diagnoses   Final diagnoses:  Shortness of breath    ED Discharge Orders    None  Claude Manges, PA-C 11/07/17 1513    Claude Manges, PA-C 11/07/17 1514    Azalia Bilis, MD 11/07/17 1756

## 2017-11-07 NOTE — ED Triage Notes (Signed)
Pt reports onset of sob last night. Denies cough or fever. No resp distress is noted at triage.

## 2017-11-07 NOTE — Discharge Instructions (Signed)
Please follow up with your primary care physician to further evaluate your shortness of breath symptoms. If you experience any fever, chest pain or worsening shortness of breath please return to the ED for reevaluation.

## 2017-11-14 ENCOUNTER — Emergency Department (HOSPITAL_COMMUNITY): Payer: BLUE CROSS/BLUE SHIELD

## 2017-11-14 ENCOUNTER — Emergency Department (HOSPITAL_COMMUNITY)
Admission: EM | Admit: 2017-11-14 | Discharge: 2017-11-15 | Disposition: A | Discharge status: Home / Self Care | Payer: BLUE CROSS/BLUE SHIELD | Attending: Emergency Medicine | Admitting: Emergency Medicine

## 2017-11-14 ENCOUNTER — Encounter (HOSPITAL_COMMUNITY): Payer: Self-pay

## 2017-11-14 ENCOUNTER — Other Ambulatory Visit: Payer: Self-pay

## 2017-11-14 DIAGNOSIS — R0602 Shortness of breath: Secondary | ICD-10-CM | POA: Diagnosis present

## 2017-11-14 LAB — I-STAT TROPONIN, ED: Troponin i, poc: 0 ng/mL (ref 0.00–0.08)

## 2017-11-14 LAB — I-STAT BETA HCG BLOOD, ED (MC, WL, AP ONLY): I-stat hCG, quantitative: <5 m[IU]/mL (ref <5)

## 2017-11-14 MED ORDER — ALBUTEROL SULFATE (2.5 MG/3ML) 0.083% IN NEBU
5.0000 mg | INHALATION_SOLUTION | Freq: Once | RESPIRATORY_TRACT | Status: AC
  Administered 2017-11-14: 5 mg via RESPIRATORY_TRACT
  Filled 2017-11-14: qty 6

## 2017-11-14 NOTE — ED Provider Notes (Signed)
[facility]-EMERGENCY DEPT Provider Note   CSN: [id number] Arrival date & time: 11/14/17  2204     History   Chief Complaint Chief Complaint  Patient presents with  . Shortness of Breath    HPI Karen Yang is a 22 y.o. female.  The history is provided by the patient and medical records.    22 y.o. F with history of anemia, dysmenorrhea, presenting to the ED with SOB.  Patient states she has been feeling short of breath for the past week.  She was seen in the ED on 11/07/2017 and told "everything was fine".  States 4 days later on 11/11/2017 she was seen at [facility] for shortness of breath after working a 10-hour shift.  States during her shift she felt extremely short of breath.  Unsure what caused this.  States yesterday she was called and notified that there was "fluid either around her heart or in her lungs on CXR" but was not told this during initial visit.  Patient states she does not feel significantly worse since onset of symptoms, this is not sure why symptoms continue to come and go.  She has not noticed any alleviating or exacerbating factors.  States she has had some chest tightness but denies other pain.  She has not had any dizziness or feelings of syncope.  She did report some nausea today.  No cough, fever, or other URI symptoms.  She has no known pulmonary or cardiac issues.  There is significant family cardiac history of MI but at later ages.  She is not a smoker but does report being around secondhand smoke a lot at her [place].  No hx of DVT or PE, she is on depo for control of heavy menses.  No LE edema or calf pain.  States she was given rescue inhaler which initially helped her symptoms but is not any longer.  Past Medical History:  Diagnosis Date  . Anemia   . Blood transfusion without reported diagnosis    as an infant  . Dysmenorrhea     Patient Active Problem List   Diagnosis Date Noted  . Pelvic pain 06/08/2017  .  Dysmenorrhea   . Anemia     Past Surgical History:  Procedure Laterality Date  . wisdom teeth removal       OB History    Gravida  0   Para  0   Term  0   Preterm  0   AB  0   Living  0     SAB  0   TAB  0   Ectopic  0   Multiple  0   Live Births  0            Home Medications    Prior to Admission medications   Medication Sig Start Date End Date Taking? Authorizing Provider  Calcium Carbonate-Vit D-Min (CALCIUM 1200 PO) Take by mouth daily.    [provider]  ferrous sulfate (SLOW RELEASE IRON) 160 (50 Fe) MG TBCR SR tablet Take 1 tablet by mouth daily.    [provider]  medroxyPROGESTERone (DEPO-PROVERA) 150 MG/ML injection Inject 150 mg into the muscle every 3 (three) months. 04/21/17   [provider]    Family History Family History  Problem Relation Age of Onset  . Thyroid disease Mother   . Ovarian cysts Sister        half sister, aged 57 when cyst was removed    Social  History Social History   Tobacco Use  . Smoking status: Never Smoker  . Smokeless tobacco: Never Used  Substance Use Topics  . Alcohol use: No  . Drug use: No     Allergies   Patient has no known allergies.   Review of Systems Review of Systems  Respiratory: Positive for shortness of breath.   All other systems reviewed and are negative.    Physical Exam Updated Vital Signs BP 125/73 (BP Location: Left Arm)   Pulse 73   Temp 98.4 F (36.9 C) (Oral)   Resp 18   SpO2 100%   Physical Exam  Constitutional: She is oriented to person, place, and time. She appears well-developed and well-nourished.  HENT:  Head: Normocephalic and atraumatic.  Mouth/Throat: Oropharynx is clear and moist.  Eyes: Pupils are equal, round, and reactive to light. Conjunctivae and EOM are normal.  Neck: Normal range of motion.  Cardiovascular: Normal rate, regular rhythm and normal heart sounds.  Pulmonary/Chest: Effort normal and breath sounds  normal. She has no wheezes. She has no rhonchi.  Lungs clear, no acute distress, reports feeling like she can't get a full, deep breath  Abdominal: Soft. Bowel sounds are normal.  Musculoskeletal: Normal range of motion.  No calf asymmetry, tenderness, or palpable cords, ambulatory with steady gait  Neurological: She is alert and oriented to person, place, and time.  Skin: Skin is warm and dry.  Psychiatric: She has a normal mood and affect.  Nursing note and vitals reviewed.    ED Treatments / Results  Labs (all labs ordered are listed, but only abnormal results are displayed) Labs Reviewed  CBC WITH DIFFERENTIAL/PLATELET - Abnormal; Notable for the following components:      Result Value   RBC 5.25 (*)    Hemoglobin 15.2 (*)    All other components within normal limits  BASIC METABOLIC PANEL - Abnormal; Notable for the following components:   CO2 21 (*)    All other components within normal limits  BRAIN NATRIURETIC PEPTIDE  D-DIMER, QUANTITATIVE (NOT AT [facility])  I-STAT TROPONIN, ED  I-STAT BETA HCG BLOOD, ED ([facility], [facility], [facility] ONLY)    EKG EKG Interpretation  Date/Time:  Sunday November 14 2017 23:37:37 EDT Ventricular Rate:  73 PR Interval:    QRS Duration: 95 QT Interval:  385 QTC Calculation: 425 R Axis:   82 Text Interpretation:  Sinus rhythm RSR' in V1 or V2, right VCD or RVH No acute changes Nonspecific T wave abnormality in III Confirmed by Derwood Kaplan [id number]) on 11/15/2017 2:27:51 AM   Radiology Dg Chest 2 View  Result Date: 11/14/2017 CLINICAL DATA:  Chest tightness, shortness of Breath EXAM: CHEST - 2 VIEW COMPARISON:  11/07/2017 FINDINGS: Heart and mediastinal contours are within normal limits. No focal opacities or effusions. No acute bony abnormality. IMPRESSION: No active cardiopulmonary disease. Electronically Signed   By: Charlett Nose M.D.   On: 11/14/2017 23:13    Procedures Procedures (including critical care time)  Medications Ordered in  ED Medications  albuterol (PROVENTIL) (2.5 MG/3ML) 0.083% nebulizer solution 5 mg (5 mg Nebulization Given 11/14/17 2346)     Initial Impression / Assessment and Plan / ED Course  I have reviewed the triage vital signs and the nursing notes.  Pertinent labs & imaging results that were available during my care of the patient were reviewed by me and considered in my medical decision making (see chart for details).  22 year old female here with shortness of breath for the  past week.  She was seen here on 11/07/2017 for same with negative work-up.  Been seen by urgent care 4 days later and discharged with albuterol inhaler.  States she was notified yesterday that there was possibly "fluid on her lungs were around her heart".  This was not made clear to her during initial visit at urgent care.  Symptoms have been intermittent, no alleviating or exacerbating factors.  She is not a smoker.  She is currently on Depo-Provera and EKG is nonischemic.  Labs including d-dimer, troponin, and BNP are all within normal limits.  She has history of anemia but hemoglobin is stable here.  Patient was treated here with albuterol neb which she states made her feel little "jittery" but has improved her shortness of breath.  At this point, low suspicion for ACS, PE, dissection, acute cardiac event.  I recommended that she follow up with primary care doctor, may need some pulmonary function testing.  She was given copies of her labs and imaging studies from today's visit for physician review.  Can continue using albuterol inhaler as needed.  She will return here for any new or worsening symptoms.  Final Clinical Impressions(s) / ED Diagnoses   Final diagnoses:  SOB (shortness of breath)    ED Discharge Orders    None       Garlon Hatchet, PA-C 11/15/17 0254    Margarita Grizzle, MD 11/15/17 1710

## 2017-11-14 NOTE — ED Triage Notes (Signed)
Pt reports SOB x1 week. She reports that she has been seen 2x for same. Endorses nausea that started today. A&Ox4. Ambulatory.

## 2017-11-15 LAB — BASIC METABOLIC PANEL
Anion gap: 10 (ref 5–15)
BUN: 10 mg/dL (ref 6–20)
CALCIUM: 9.1 mg/dL (ref 8.9–10.3)
CHLORIDE: 109 mmol/L (ref 98–111)
CO2: 21 mmol/L — ABNORMAL LOW (ref 22–32)
CREATININE: 0.56 mg/dL (ref 0.44–1.00)
GFR calc Af Amer: >60 mL/min (ref >60)
GFR calc non Af Amer: >60 mL/min (ref >60)
Glucose, Bld: 85 mg/dL (ref 70–99)
Potassium: 3.6 mmol/L (ref 3.5–5.1)
SODIUM: 140 mmol/L (ref 135–145)

## 2017-11-15 LAB — CBC WITH DIFFERENTIAL/PLATELET
Abs Immature Granulocytes: 0.02 10*3/uL (ref 0.00–0.07)
BASOS ABS: 0 10*3/uL (ref 0.0–0.1)
Basophils Relative: 0 %
EOS ABS: 0.1 10*3/uL (ref 0.0–0.5)
EOS PCT: 1 %
HCT: 46 % (ref 36.0–46.0)
HEMOGLOBIN: 15.2 g/dL — AB (ref 12.0–15.0)
Immature Granulocytes: 0 %
LYMPHS PCT: 24 %
Lymphs Abs: 1.7 10*3/uL (ref 0.7–4.0)
MCH: 29 pg (ref 26.0–34.0)
MCHC: 33 g/dL (ref 30.0–36.0)
MCV: 87.6 fL (ref 80.0–100.0)
MONO ABS: 0.7 10*3/uL (ref 0.1–1.0)
Monocytes Relative: 9 %
Neutro Abs: 4.6 10*3/uL (ref 1.7–7.7)
Neutrophils Relative %: 66 %
Platelets: 269 10*3/uL (ref 150–400)
RBC: 5.25 MIL/uL — AB (ref 3.87–5.11)
RDW: 12.5 % (ref 11.5–15.5)
WBC: 7 10*3/uL (ref 4.0–10.5)
nRBC: 0 % (ref 0.0–0.2)

## 2017-11-15 LAB — BRAIN NATRIURETIC PEPTIDE: B NATRIURETIC PEPTIDE 5: 9.4 pg/mL (ref 0.0–100.0)

## 2017-11-15 LAB — D-DIMER, QUANTITATIVE: D-Dimer, Quant: 0.34 ug/mL-FEU (ref 0.00–0.50)

## 2017-11-15 NOTE — Discharge Instructions (Signed)
All your tests today looked great. I recommend to follow-up with your primary care doctor, they may need to schedule you for some pulmonary function tests. Have them review your labs from today. Return here for any new/acute changes.

## 2018-04-03 ENCOUNTER — Encounter: Payer: Self-pay | Admitting: Gynecology

## 2018-04-03 ENCOUNTER — Other Ambulatory Visit: Payer: Self-pay

## 2018-04-03 ENCOUNTER — Ambulatory Visit
Admission: EM | Admit: 2018-04-03 | Discharge: 2018-04-03 | Disposition: A | Discharge status: Home / Self Care | Payer: BLUE CROSS/BLUE SHIELD | Attending: Emergency Medicine | Admitting: Emergency Medicine

## 2018-04-03 ENCOUNTER — Ambulatory Visit (INDEPENDENT_AMBULATORY_CARE_PROVIDER_SITE_OTHER): Payer: BLUE CROSS/BLUE SHIELD

## 2018-04-03 DIAGNOSIS — X501XXA Overexertion from prolonged static or awkward postures, initial encounter: Secondary | ICD-10-CM | POA: Diagnosis not present

## 2018-04-03 DIAGNOSIS — S9032XA Contusion of left foot, initial encounter: Secondary | ICD-10-CM

## 2018-04-03 NOTE — ED Provider Notes (Signed)
[facility]    CSN: [id number] Arrival date & time: 04/03/18  1443     History   Chief Complaint No chief complaint on file.   HPI Karen Yang is a 23 y.o. female.   HPI  23 year old female presents accompanied by her mother complaining of left forefoot pain that she has had for 2 days.. States that she worked for 13 hours steadily on her feet prior to the pain beginning.  She has discomfort with ambulation and if she stands.  There is been no bruising and no swelling.        Past Medical History:  Diagnosis Date  . Anemia   . Blood transfusion without reported diagnosis    as an infant  . Dysmenorrhea     Patient Active Problem List   Diagnosis Date Noted  . Pelvic pain 06/08/2017  . Dysmenorrhea   . Anemia     Past Surgical History:  Procedure Laterality Date  . wisdom teeth removal      OB History    Gravida  0   Para  0   Term  0   Preterm  0   AB  0   Living  0     SAB  0   TAB  0   Ectopic  0   Multiple  0   Live Births  0            Home Medications    Prior to Admission medications   Medication Sig Start Date End Date Taking? Authorizing Provider  medroxyPROGESTERone (DEPO-PROVERA) 150 MG/ML injection Inject 150 mg into the muscle every 3 (three) months. 04/21/17  Yes [provider]  PROAIR HFA 108 (90 Base) MCG/ACT inhaler Inhale 1 puff into the lungs every 6 (six) hours as needed for wheezing or shortness of breath.  11/11/17  Yes [provider]    Family History Family History  Problem Relation Age of Onset  . Thyroid disease Mother   . Ovarian cysts Sister        half sister, aged 30 when cyst was removed    Social History Social History   Tobacco Use  . Smoking status: Never Smoker  . Smokeless tobacco: Never Used  Substance Use Topics  . Alcohol use: No  . Drug use: No     Allergies   Patient has no known allergies.   Review of Systems Review of Systems    Constitutional: Positive for activity change. Negative for appetite change, chills, fatigue and fever.  Musculoskeletal: Positive for gait problem and myalgias.  All other systems reviewed and are negative.    Physical Exam Triage Vital Signs ED Triage Vitals  Enc Vitals Group     BP 04/03/18 1530 129/76     Pulse Rate 04/03/18 1530 78     Resp 04/03/18 1530 16     Temp 04/03/18 1530 97.9 F (36.6 C)     Temp src --      SpO2 04/03/18 1530 100 %     Weight 04/03/18 1532 116 lb 12.8 oz (53 kg)     Height 04/03/18 1532 5\' 3"  (1.6 m)     Head Circumference --      Peak Flow --      Pain Score 04/03/18 1531 3     Pain Loc --      Pain Edu? --      Excl. in GC? --    No data found.  Updated  Vital Signs BP 129/76 (BP Location: Left Arm)   Pulse 78   Temp 97.9 F (36.6 C)   Resp 16   Ht 5\' 3"  (1.6 m)   Wt 116 lb 12.8 oz (53 kg)   SpO2 100%   BMI 20.69 kg/m   Visual Acuity Right Eye Distance:   Left Eye Distance:   Bilateral Distance:    Right Eye Near:   Left Eye Near:    Bilateral Near:     Physical Exam Vitals signs and nursing note reviewed.  Constitutional:      General: She is not in acute distress.    Appearance: Normal appearance. She is normal weight. She is not ill-appearing, toxic-appearing or diaphoretic.  HENT:     Head: Normocephalic.  Eyes:     General:        Right eye: No discharge.        Left eye: No discharge.     Conjunctiva/sclera: Conjunctivae normal.  Neck:     Musculoskeletal: Normal range of motion and neck supple.  Musculoskeletal: Normal range of motion.        General: Tenderness and signs of injury present. No swelling or deformity.     Comments: Examination of the left foot in comparison to the right shows no ecchymosis erythema induration swelling the forefoot.  No pain of the malleolaii or of the tarsal region.  She has no calcaneal pain.  Pain is sharply localized over the fourth metatarsal at the juncture of the middle and  distal thirds.  Is not any crepitus in this area.  Skin:    General: Skin is warm and dry.  Neurological:     General: No focal deficit present.     Mental Status: She is alert and oriented to person, place, and time.  Psychiatric:        Mood and Affect: Mood normal.        Behavior: Behavior normal.      UC Treatments / Results  Labs (all labs ordered are listed, but only abnormal results are displayed) Labs Reviewed - No data to display  EKG None  Radiology Dg Foot Complete Left  Result Date: 04/03/2018 CLINICAL DATA:  Fourth and fifth toe pain for the past 2 days. No injury. EXAM: LEFT FOOT - COMPLETE 3+ VIEW COMPARISON:  None. FINDINGS: No acute fracture or dislocation. Joint spaces are preserved. Osteopenia. Soft tissues are unremarkable. IMPRESSION: 1.  No acute osseous abnormality. 2. Osteopenia. Electronically Signed   By: Obie Dredge M.D.   On: 04/03/2018 16:50    Procedures Procedures (including critical care time)  Medications Ordered in UC Medications - No data to display  Initial Impression / Assessment and Plan / UC Course  I have reviewed the triage vital signs and the nursing notes.  Pertinent labs & imaging results that were available during my care of the patient were reviewed by me and considered in my medical decision making (see chart for details).   I reviewed the x-rays with the patient and her mother.  No evidence of any bony abnormality.  Likely she has a contusion from the prolonged standing but also must keep in mind that it could possibly be a early stress fracture.  She continues to have pain I have recommended that she be seen at orthopedics at [facility] orthopedics.  Otherwise I have recommended she not stand prolonged for the next 7 days.  Use ice and elevation as necessary for comfort.   Final Clinical  Impressions(s) / UC Diagnoses   Final diagnoses:  Contusion of left foot, initial encounter     Discharge Instructions     Rest  ice and elevation.  IbuProfen for pain.  If not improving follow-up with [facility] orthopedics    ED Prescriptions    None     Controlled Substance Prescriptions [place] Controlled Substance Registry consulted? Not Applicable   Lutricia Feil, PA-C 04/03/18 Rickey Primus

## 2018-04-03 NOTE — Discharge Instructions (Addendum)
Rest ice and elevation.  IbuProfen for pain.  If not improving follow-up with [facility] orthopedics

## 2018-04-03 NOTE — ED Triage Notes (Signed)
Patient c/o left foot pain x 2 days ago. Per patient work for 13 hrs on her feet.

## 2018-07-08 ENCOUNTER — Encounter (HOSPITAL_COMMUNITY): Payer: Self-pay | Admitting: *Deleted

## 2018-07-08 ENCOUNTER — Emergency Department (HOSPITAL_COMMUNITY)
Admission: EM | Admit: 2018-07-08 | Discharge: 2018-07-09 | Disposition: A | Discharge status: Home / Self Care | Payer: BC Managed Care – PPO | Attending: Emergency Medicine | Admitting: Emergency Medicine

## 2018-07-08 ENCOUNTER — Other Ambulatory Visit: Payer: Self-pay

## 2018-07-08 DIAGNOSIS — S299XXA Unspecified injury of thorax, initial encounter: Secondary | ICD-10-CM | POA: Diagnosis present

## 2018-07-08 DIAGNOSIS — Y9239 Other specified sports and athletic area as the place of occurrence of the external cause: Secondary | ICD-10-CM | POA: Diagnosis not present

## 2018-07-08 DIAGNOSIS — Y999 Unspecified external cause status: Secondary | ICD-10-CM | POA: Diagnosis not present

## 2018-07-08 DIAGNOSIS — W2189XA Striking against or struck by other sports equipment, initial encounter: Secondary | ICD-10-CM | POA: Diagnosis not present

## 2018-07-08 DIAGNOSIS — S20211A Contusion of right front wall of thorax, initial encounter: Secondary | ICD-10-CM | POA: Diagnosis not present

## 2018-07-08 DIAGNOSIS — Z79899 Other long term (current) drug therapy: Secondary | ICD-10-CM | POA: Diagnosis not present

## 2018-07-08 DIAGNOSIS — Y9353 Activity, golf: Secondary | ICD-10-CM | POA: Diagnosis not present

## 2018-07-08 NOTE — ED Triage Notes (Signed)
Pt reports that earlier this evening she was playing putt-putt with her boyfriend when she bent over and jabbed the club end into her right chest underneath her breast.  Concurrently, her bra under wire dug into her chest wall as well.  Slight bruising noted.  Pt reports being unable to take deep breathes but O2 is 99% RA.

## 2018-07-08 NOTE — ED Notes (Signed)
Bed: WA07 Expected date:  Expected time:  Means of arrival:  Comments: 

## 2018-07-09 NOTE — ED Provider Notes (Signed)
[facility] DEPT Provider Note   CSN: [id number] Arrival date & time: 07/08/18  2148     History   Chief Complaint Chief Complaint  Patient presents with  . Breast Pain    right    HPI Karen Yang is a 23 y.o. female who presents with right chest wall pain.  Past medical history significant for asthma.  Patient states that she was playing putt putt tonight and the end of the club rammed into her right chest wall just underneath her breast.  The underwire of her bra dug into her skin.  She states she initially did not have any pain or trouble breathing however several hours later when she went home she started to have difficulty breathing and started crying.  Her boyfriend was very concerned and therefore brought her to the emergency department.  Since she has been waiting to be seen she states her symptoms have improved.  She denies any significant pain or trouble breathing currently.     HPI  Past Medical History:  Diagnosis Date  . Anemia   . Blood transfusion without reported diagnosis    as an infant  . Dysmenorrhea     Patient Active Problem List   Diagnosis Date Noted  . Pelvic pain 06/08/2017  . Dysmenorrhea   . Anemia     Past Surgical History:  Procedure Laterality Date  . wisdom teeth removal       OB History    Gravida  0   Para  0   Term  0   Preterm  0   AB  0   Living  0     SAB  0   TAB  0   Ectopic  0   Multiple  0   Live Births  0            Home Medications    Prior to Admission medications   Medication Sig Start Date End Date Taking? Authorizing Provider  medroxyPROGESTERone (DEPO-PROVERA) 150 MG/ML injection Inject 150 mg into the muscle every 3 (three) months. 04/21/17   [provider]  PROAIR HFA 108 (90 Base) MCG/ACT inhaler Inhale 1 puff into the lungs every 6 (six) hours as needed for wheezing or shortness of breath.  11/11/17   [provider]    Family  History Family History  Problem Relation Age of Onset  . Thyroid disease Mother   . Ovarian cysts Sister        half sister, aged 63 when cyst was removed    Social History Social History   Tobacco Use  . Smoking status: Never Smoker  . Smokeless tobacco: Never Used  Substance Use Topics  . Alcohol use: No  . Drug use: No     Allergies   Patient has no known allergies.   Review of Systems Review of Systems  Respiratory: Positive for shortness of breath.   Cardiovascular: Positive for chest pain.     Physical Exam Updated Vital Signs BP 127/84 (BP Location: Left Arm)   Pulse 90   Temp 98.6 F (37 C) (Oral)   Resp 16   Ht 5\' 3"  (1.6 m)   Wt 54.9 kg   SpO2 99%   BMI 21.43 kg/m   Physical Exam Vitals signs and nursing note reviewed.  Constitutional:      General: She is not in acute distress.    Appearance: Normal appearance. She is well-developed. She is not ill-appearing.  Comments: Texting on phone  HENT:     Head: Normocephalic and atraumatic.  Eyes:     General: No scleral icterus.       Right eye: No discharge.        Left eye: No discharge.     Conjunctiva/sclera: Conjunctivae normal.     Pupils: Pupils are equal, round, and reactive to light.  Neck:     Musculoskeletal: Normal range of motion.  Cardiovascular:     Rate and Rhythm: Normal rate and regular rhythm.  Pulmonary:     Effort: Pulmonary effort is normal. No respiratory distress.     Breath sounds: Normal breath sounds.  Chest:     Chest wall: No tenderness.  Abdominal:     General: There is no distension.  Skin:    General: Skin is warm and dry.  Neurological:     Mental Status: She is alert and oriented to person, place, and time.  Psychiatric:        Behavior: Behavior normal.      ED Treatments / Results  Labs (all labs ordered are listed, but only abnormal results are displayed) Labs Reviewed - No data to display  EKG    Radiology No results found.   Procedures Procedures (including critical care time)  Medications Ordered in ED Medications - No data to display   Initial Impression / Assessment and Plan / ED Course  I have reviewed the triage vital signs and the nursing notes.  Pertinent labs & imaging results that were available during my care of the patient were reviewed by me and considered in my medical decision making (see chart for details).  23 year old female presents with chest pain and trouble breathing after the end of a putt putt club hit her chest wall.  Her vital signs are normal.  She is in no acute distress.  Heart is regular rate and rhythm and lungs are clear to auscultation.  She has no tenderness on exam of her chest wall and no significant signs of trauma.  She was offered imaging here and declined.  She was given return precautions  Final Clinical Impressions(s) / ED Diagnoses   Final diagnoses:  Chest wall contusion, right, initial encounter    ED Discharge Orders    None       Bethel BornGekas, Kelly Marie, PA-C 07/09/18 0022    Gilda CreasePollina, Christopher J, MD 07/09/18 [phone]

## 2018-10-06 ENCOUNTER — Ambulatory Visit (INDEPENDENT_AMBULATORY_CARE_PROVIDER_SITE_OTHER): Payer: BC Managed Care – PPO | Admitting: Obstetrics & Gynecology

## 2018-10-06 ENCOUNTER — Other Ambulatory Visit: Payer: Self-pay

## 2018-10-06 ENCOUNTER — Telehealth: Payer: Self-pay | Admitting: Obstetrics & Gynecology

## 2018-10-06 ENCOUNTER — Encounter: Payer: Self-pay | Admitting: Obstetrics & Gynecology

## 2018-10-06 VITALS — BP 132/60 | HR 102 | Temp 97.6°F | Ht 63.0 in | Wt 127.4 lb

## 2018-10-06 DIAGNOSIS — R102 Pelvic and perineal pain: Secondary | ICD-10-CM | POA: Diagnosis not present

## 2018-10-06 DIAGNOSIS — N898 Other specified noninflammatory disorders of vagina: Secondary | ICD-10-CM | POA: Diagnosis not present

## 2018-10-06 DIAGNOSIS — R140 Abdominal distension (gaseous): Secondary | ICD-10-CM

## 2018-10-06 DIAGNOSIS — N941 Unspecified dyspareunia: Secondary | ICD-10-CM | POA: Diagnosis not present

## 2018-10-06 DIAGNOSIS — N926 Irregular menstruation, unspecified: Secondary | ICD-10-CM | POA: Diagnosis not present

## 2018-10-06 LAB — POCT URINE PREGNANCY: Preg Test, Ur: NEGATIVE

## 2018-10-06 MED ORDER — ESTRADIOL 0.1 MG/GM VA CREA: TOPICAL_CREAM | VAGINAL | 1 refills | Status: DC

## 2018-10-06 NOTE — Telephone Encounter (Signed)
Spoke with patient. Patient reports intermittent pelvic pain since intercourse on Sunday night. Describes as mostly on her left side, but sometimes both side. Thick, white vaginal d/c, no odor. On depo provera, received last injection at [facility] on 09/13/18. No menses with depo. Denies N/V, fever/chills, urinary symptoms or vaginal odor. [id number] prescreen negative, precautions reviewed. OV scheduled for today at 4:30pm with Dr. Sabra Heck.

## 2018-10-06 NOTE — Telephone Encounter (Signed)
Left message to call Brittian Renaldo, RN at [facility] [phone].   

## 2018-10-06 NOTE — Telephone Encounter (Signed)
Patient sent the following message through [facility]. Routing to triage to assist patient with request.  Appointment Request From: Frutoso Schatz  With Provider: Megan Salon, MD [facility]]  Preferred Date Range: 10/07/2018 - 10/11/2018  Preferred Times: Any Time  Reason for visit: Office Visit  Comments: Due for check-up but also having some pain in the pelvic area, almost like period cramps even though I haven't had a period in about a year because of being on Depo.

## 2018-10-06 NOTE — Telephone Encounter (Signed)
Encounter closed

## 2018-10-06 NOTE — Progress Notes (Signed)
GYNECOLOGY  VISIT  CC:   Pelvic pain on and off, discharge  HPI: 23 y.o. G0P0000 Single White or Caucasian female here for intermittent pelvic pain that has been present since I last saw her.  She is also having some white discharge that is thick and white that has been present for several week.     She is on Depo Provera and has been on this over a year.  Doesn't bleed for a full two months after initial injection.  She then has some spotting during the last month.  She doesn't have anything that is like a menstrual cycle.  She does have continued pelvic pain that is intermittent.  Most of the time, when the pain occurs, she feels it most when she is lying down and resting.  There is always more on the left side than right.  Typically it just lasts for a few days and not necessarily every month.    She is experiencing burning with insertion.  She has tried some lubricant but this just made her more irritated.  Hasn't used since.  Can't remember name.    She does have issues with bloating after eating.  Does not have nausea.  Typically, she has a bowel movement every other day.  Stool is typically hard.  She is trying to take a pro-biotic.  This is an OTC from [employer].  She is a Equities trader with credits.  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    She is now engaged.  Finacee wanted to come to office visit.  We agreed to call him after exam and discuss findings/recommendations.  GYNECOLOGIC HISTORY: No LMP recorded. Patient has had an injection. Contraception: depo provera Menopausal hormone therapy: none  Patient Active Problem List   Diagnosis Date Noted  . Pelvic pain 06/08/2017  . Dysmenorrhea   . Anemia     Past Medical History:  Diagnosis Date  . Anemia   . Blood transfusion without reported diagnosis    as an infant  . Dysmenorrhea     Past Surgical History:  Procedure Laterality Date  . wisdom teeth removal       MEDS:   Current Outpatient Medications on File Prior to Visit  Medication Sig Dispense Refill  . medroxyPROGESTERone (DEPO-PROVERA) 150 MG/ML injection Inject 150 mg into the muscle every 3 (three) months.  0  . PROAIR HFA 108 (90 Base) MCG/ACT inhaler Inhale 1 puff into the lungs every 6 (six) hours as needed for wheezing or shortness of breath.   0   No current facility-administered medications on file prior to visit.     ALLERGIES: Patient has no known allergies.  Family History  Problem Relation Age of Onset  . Thyroid disease Mother   . Ovarian cysts Sister        half sister, aged 33 when cyst was removed    SH:  Engaged, non smoker  Review of Systems  Gastrointestinal: Positive for abdominal distention.  Genitourinary: Positive for vaginal discharge and vaginal pain.  All other systems reviewed and are negative.   PHYSICAL EXAMINATION:    BP 132/60   Pulse (!) 102   Temp 97.6 F (36.4 C) (Temporal)   Ht 5\' 3"  (1.6 m)   Wt 127 lb 6.4 oz (57.8 kg)   BMI 22.57 kg/m     General appearance: alert, cooperative and appears stated age CV:  Regular rate and rhythm Lungs:  clear to auscultation, no wheezes, rales or rhonchi, symmetric air entry Abdomen: soft, non-tender; bowel sounds normal; no masses,  no organomegaly Lymph:  no inguinal LAD noted  Pelvic: External genitalia:  no lesions              Urethra:  normal appearing urethra with no masses, tenderness or lesions              Bartholins and Skenes: normal                 Vagina: normal appearing vagina with normal color and whitish discharge noted, no lesions              Cervix: no lesions              Bimanual Exam:  Uterus:  normal size, contour, position, consistency, mobility, non-tender              Adnexa: no mass, fullness, tenderness  Chaperone was present for exam.  Assessment: White discharge Intermittent pelvic pain Dyspareunia, only with insertion Bloating with constipation Irregular  bleeding due to Depo Provera  Plan: Affirm vaginitis testing obtained.  No STD testing obtained and pt and fiancee are each other's only sexual partners.  Results and recommendations will be called to pt. We have discussed laparoscopy in the past.  May need to consider if continues. Recommended starting probiotic, Align, for one month.  Possible IBS with constipation due to abdominal distension.  May need GI referral.  She will give me an update in one month. Trial of topical vaginal estrogen recommended to use topically where skin hurts with insertion.  She will apply topically twice weekly.   ~30 minutes spent with patient >50% of time was in face to face discussion of above.  We did call fiance and discuss findings, tests and current recommendations.  His questions were answered as well.

## 2018-10-07 ENCOUNTER — Encounter (HOSPITAL_COMMUNITY): Payer: Self-pay

## 2018-10-07 ENCOUNTER — Other Ambulatory Visit: Payer: Self-pay

## 2018-10-07 DIAGNOSIS — Y999 Unspecified external cause status: Secondary | ICD-10-CM | POA: Insufficient documentation

## 2018-10-07 DIAGNOSIS — Y9389 Activity, other specified: Secondary | ICD-10-CM | POA: Diagnosis not present

## 2018-10-07 DIAGNOSIS — Y29XXXA Contact with blunt object, undetermined intent, initial encounter: Secondary | ICD-10-CM | POA: Insufficient documentation

## 2018-10-07 DIAGNOSIS — S0990XA Unspecified injury of head, initial encounter: Secondary | ICD-10-CM | POA: Diagnosis present

## 2018-10-07 DIAGNOSIS — Y92830 Public park as the place of occurrence of the external cause: Secondary | ICD-10-CM | POA: Diagnosis not present

## 2018-10-07 DIAGNOSIS — G43809 Other migraine, not intractable, without status migrainosus: Secondary | ICD-10-CM | POA: Insufficient documentation

## 2018-10-07 DIAGNOSIS — S0003XA Contusion of scalp, initial encounter: Secondary | ICD-10-CM | POA: Diagnosis not present

## 2018-10-07 LAB — URINALYSIS, ROUTINE W REFLEX MICROSCOPIC
Bilirubin Urine: NEGATIVE
Glucose, UA: NEGATIVE mg/dL
Ketones, ur: NEGATIVE mg/dL
Nitrite: NEGATIVE
Protein, ur: NEGATIVE mg/dL
Specific Gravity, Urine: 1.017 (ref 1.005–1.030)
WBC, UA: >50 WBC/hpf — ABNORMAL HIGH (ref 0–5)
pH: 6 (ref 5.0–8.0)

## 2018-10-07 LAB — CBC
HCT: 46.8 % — ABNORMAL HIGH (ref 36.0–46.0)
Hemoglobin: 15.6 g/dL — ABNORMAL HIGH (ref 12.0–15.0)
MCH: 28.8 pg (ref 26.0–34.0)
MCHC: 33.3 g/dL (ref 30.0–36.0)
MCV: 86.3 fL (ref 80.0–100.0)
Platelets: 300 10*3/uL (ref 150–400)
RBC: 5.42 MIL/uL — ABNORMAL HIGH (ref 3.87–5.11)
RDW: 13.2 % (ref 11.5–15.5)
WBC: 7.5 10*3/uL (ref 4.0–10.5)
nRBC: 0 % (ref 0.0–0.2)

## 2018-10-07 LAB — BASIC METABOLIC PANEL
Anion gap: 10 (ref 5–15)
BUN: 8 mg/dL (ref 6–20)
CO2: 19 mmol/L — ABNORMAL LOW (ref 22–32)
Calcium: 9.4 mg/dL (ref 8.9–10.3)
Chloride: 112 mmol/L — ABNORMAL HIGH (ref 98–111)
Creatinine, Ser: 0.76 mg/dL (ref 0.44–1.00)
GFR calc Af Amer: >60 mL/min (ref >60)
GFR calc non Af Amer: >60 mL/min (ref >60)
Glucose, Bld: 104 mg/dL — ABNORMAL HIGH (ref 70–99)
Potassium: 3.7 mmol/L (ref 3.5–5.1)
Sodium: 141 mmol/L (ref 135–145)

## 2018-10-07 LAB — VAGINITIS/VAGINOSIS, DNA PROBE
Candida Species: NEGATIVE
Gardnerella vaginalis: POSITIVE — AB
Trichomonas vaginosis: NEGATIVE

## 2018-10-07 LAB — I-STAT BETA HCG BLOOD, ED (MC, WL, AP ONLY): I-stat hCG, quantitative: <5 m[IU]/mL (ref <5)

## 2018-10-07 MED ORDER — SODIUM CHLORIDE 0.9% FLUSH: 3.0000 mL | Freq: Once | INTRAVENOUS | Status: DC

## 2018-10-07 MED ORDER — METRONIDAZOLE 500 MG PO TABS: 500.0000 mg | ORAL_TABLET | Freq: Two times a day (BID) | ORAL | 0 refills | Status: DC

## 2018-10-07 NOTE — ED Triage Notes (Signed)
Patient reports hitting her head hard Monday while playing with little brothers on a play ground and hit her head on a wood plank.    Patient reports nausea on Monday that has gotten worse and now has developed pressure behind left eye. Patient states her vision in left eye has been spotty.    8/10 pain in head and behind eye.   Patient denies passing out but states she was very dizzy and felt as if she was going to pass out.    A/[facility] Ambulatory in triage.

## 2018-10-08 ENCOUNTER — Emergency Department (HOSPITAL_COMMUNITY)
Admission: EM | Admit: 2018-10-08 | Discharge: 2018-10-08 | Disposition: A | Discharge status: Home / Self Care | Payer: BC Managed Care – PPO | Attending: Emergency Medicine | Admitting: Emergency Medicine

## 2018-10-08 DIAGNOSIS — G43109 Migraine with aura, not intractable, without status migrainosus: Secondary | ICD-10-CM

## 2018-10-08 DIAGNOSIS — S0003XA Contusion of scalp, initial encounter: Secondary | ICD-10-CM

## 2018-10-08 MED ORDER — METOCLOPRAMIDE HCL 10 MG PO TABS
10.0000 mg | ORAL_TABLET | Freq: Once | ORAL | Status: AC
  Administered 2018-10-08: 01:00:00 10 mg via ORAL
  Filled 2018-10-08: qty 1

## 2018-10-08 MED ORDER — BUTALBITAL-APAP-CAFFEINE 50-325-40 MG PO TABS
2.0000 | ORAL_TABLET | Freq: Once | ORAL | Status: AC
  Administered 2018-10-08: 2 via ORAL
  Filled 2018-10-08: qty 2

## 2018-10-08 NOTE — Discharge Instructions (Signed)
Your work-up today suggested that you were mildly dehydrated.  Drink plenty of water.  You may continue with Excedrin Migraine for management of persistent symptoms.  Follow-up with neurology as well as your primary care doctor.  You may return for any new or concerning symptoms.

## 2018-10-08 NOTE — ED Provider Notes (Signed)
[facility]-EMERGENCY DEPT Provider Note   CSN: [id number] Arrival date & time: 10/07/18  1557     History   Chief Complaint Chief Complaint  Patient presents with  . Hit head  . Near Syncope    HPI Karen Yang is a 23 y.o. female.     23 year old female presents to the emergency department for evaluation of a left-sided headache.  She states that the pain is mostly behind her left eye.  She describes it as a pressure.  It can be aggravated with lateral gaze to the right.  She has not taken any medications for her symptoms.  Reports that her headache has been constant and worse since striking her head on a wood plank at the park.  This was 4 days ago.  She did not lose consciousness at the time of the incident.  Had some mild nausea which has resolved.  No associated extremity numbness or paresthesias, extremity weakness, complete vision loss, tinnitus or hearing loss.  The history is provided by the patient. No language interpreter was used.  Near Syncope    Past Medical History:  Diagnosis Date  . Anemia   . Blood transfusion without reported diagnosis    as an infant  . Dysmenorrhea     Patient Active Problem List   Diagnosis Date Noted  . Pelvic pain 06/08/2017  . Dysmenorrhea   . Anemia     Past Surgical History:  Procedure Laterality Date  . wisdom teeth removal       OB History    Gravida  0   Para  0   Term  0   Preterm  0   AB  0   Living  0     SAB  0   TAB  0   Ectopic  0   Multiple  0   Live Births  0            Home Medications    Prior to Admission medications   Medication Sig Start Date End Date Taking? Authorizing Provider  estradiol (ESTRACE) 0.1 MG/GM vaginal cream Apply topically and as directed twice weekly. 10/06/18   Jerene Bears, MD  medroxyPROGESTERone (DEPO-PROVERA) 150 MG/ML injection Inject 150 mg into the muscle every 3 (three) months. 04/21/17   [provider]   metroNIDAZOLE (FLAGYL) 500 MG tablet Take 1 tablet (500 mg total) by mouth 2 (two) times daily. 10/07/18   Jerene Bears, MD  PROAIR HFA 108 [phone] Base) MCG/ACT inhaler Inhale 1 puff into the lungs every 6 (six) hours as needed for wheezing or shortness of breath.  11/11/17   [provider]    Family History Family History  Problem Relation Age of Onset  . Thyroid disease Mother   . Ovarian cysts Sister        half sister, aged 32 when cyst was removed    Social History Social History   Tobacco Use  . Smoking status: Never Smoker  . Smokeless tobacco: Never Used  Substance Use Topics  . Alcohol use: No  . Drug use: No     Allergies   Patient has no known allergies.   Review of Systems Review of Systems  Cardiovascular: Positive for near-syncope.   Ten systems reviewed and are negative for acute change, except as noted in the HPI.    Physical Exam Updated Vital Signs BP (!) 128/97 (BP Location: Left Arm)   Pulse 66   Temp 98.7 F (  37.1 C) (Oral)   Resp 16   SpO2 98%   Physical Exam Vitals signs and nursing note reviewed.  Constitutional:      General: She is not in acute distress.    Appearance: She is well-developed. She is not diaphoretic.     Comments: Nontoxic appearing and in NAD  HENT:     Head: Normocephalic and atraumatic.     Comments: No battle sign or raccoon's eyes    Right Ear: Tympanic membrane, ear canal and external ear normal.     Left Ear: Tympanic membrane, ear canal and external ear normal.     Ears:     Comments: No hemotympanum bilaterally    Mouth/Throat:     Mouth: Mucous membranes are moist.     Comments: Symmetric rise of the uvula with phonation Eyes:     General: No scleral icterus.    Extraocular Movements: Extraocular movements intact.     Conjunctiva/sclera: Conjunctivae normal.     Pupils: Pupils are equal, round, and reactive to light.  Neck:     Musculoskeletal: Normal range of motion.  Pulmonary:      Effort: Pulmonary effort is normal. No respiratory distress.     Comments: Respirations even and unlabored Musculoskeletal: Normal range of motion.  Skin:    General: Skin is warm and dry.     Coloration: Skin is not pale.     Findings: No erythema or rash.  Neurological:     General: No focal deficit present.     Mental Status: She is alert and oriented to person, place, and time.     Coordination: Coordination normal.     Comments: GCS 15. Speech is goal oriented. No cranial nerve deficits appreciated; symmetric eyebrow raise, no facial drooping, tongue midline. Patient has equal grip strength bilaterally with 5/5 strength against resistance in all major muscle groups bilaterally. Sensation to light touch intact. Patient moves extremities without ataxia. Normal finger-nose-finger.   Psychiatric:        Behavior: Behavior normal.      ED Treatments / Results  Labs (all labs ordered are listed, but only abnormal results are displayed) Labs Reviewed  BASIC METABOLIC PANEL - Abnormal; Notable for the following components:      Result Value   Chloride 112 (*)    CO2 19 (*)    Glucose, Bld 104 (*)    All other components within normal limits  CBC - Abnormal; Notable for the following components:   RBC 5.42 (*)    Hemoglobin 15.6 (*)    HCT 46.8 (*)    All other components within normal limits  URINALYSIS, ROUTINE W REFLEX MICROSCOPIC - Abnormal; Notable for the following components:   Hgb urine dipstick MODERATE (*)    Leukocytes,Ua LARGE (*)    WBC, UA >50 (*)    Bacteria, UA RARE (*)    Non Squamous Epithelial 0-5 (*)    All other components within normal limits  URINE CULTURE  I-STAT BETA HCG BLOOD, ED ([facility], [facility], [facility] ONLY)    EKG None  Radiology No results found.  Procedures Procedures (including critical care time)  Medications Ordered in ED Medications  sodium chloride flush (NS) 0.9 % injection 3 mL (has no administration in time range)  metoCLOPramide (REGLAN)  tablet 10 mg (10 mg Oral Given 10/08/18 0111)  butalbital-acetaminophen-caffeine (FIORICET) 50-325-40 MG per tablet 2 tablet (2 tablets Oral Given 10/08/18 0111)     Initial Impression / Assessment and Plan /  ED Course  I have reviewed the triage vital signs and the nursing notes.  Pertinent labs & imaging results that were available during my care of the patient were reviewed by me and considered in my medical decision making (see chart for details).        23 year old female presents for symptoms consistent with an ocular migraine.  This was incited by a head injury 4 days ago.  The patient is neurovascularly intact with a nonfocal neurologic exam.  Discussed the possibility of concussion, but doubt skull fracture, brain bleed, traumatic hydrocephalus.  No indication for further emergent work-up or imaging.  I did offer the patient observation in the ED following medical management.  She prefers discharge at this time.  Encouraged follow-up with her primary care doctor.  Patient discharged in stable condition with no unaddressed concerns.   Final Clinical Impressions(s) / ED Diagnoses   Final diagnoses:  Contusion of scalp, initial encounter  Ocular migraine    ED Discharge Orders         Ordered    Ambulatory referral to Neurology    Comments: An appointment is requested in approximately: 1 month   10/08/18 0102           Antony MaduraHumes, Debby Clyne, PA-C 10/08/18 0410    Eudelia Bunchardama, Amadeo GarnetPedro Eduardo, MD 10/09/18 [phone]

## 2018-10-08 NOTE — ED Notes (Signed)
Kennedy in lab to add on urine culture

## 2018-10-10 LAB — URINE CULTURE: Culture: 30000 — AB

## 2018-11-28 ENCOUNTER — Emergency Department (HOSPITAL_COMMUNITY): Payer: BC Managed Care – PPO

## 2018-11-28 ENCOUNTER — Other Ambulatory Visit: Payer: Self-pay

## 2018-11-28 ENCOUNTER — Encounter (HOSPITAL_COMMUNITY): Payer: Self-pay | Admitting: *Deleted

## 2018-11-28 ENCOUNTER — Emergency Department (HOSPITAL_COMMUNITY)
Admission: EM | Admit: 2018-11-28 | Discharge: 2018-11-28 | Disposition: A | Discharge status: Home / Self Care | Payer: BC Managed Care – PPO | Attending: Emergency Medicine | Admitting: Emergency Medicine

## 2018-11-28 DIAGNOSIS — Y929 Unspecified place or not applicable: Secondary | ICD-10-CM | POA: Diagnosis not present

## 2018-11-28 DIAGNOSIS — Y9389 Activity, other specified: Secondary | ICD-10-CM | POA: Insufficient documentation

## 2018-11-28 DIAGNOSIS — Y999 Unspecified external cause status: Secondary | ICD-10-CM | POA: Insufficient documentation

## 2018-11-28 DIAGNOSIS — S6992XA Unspecified injury of left wrist, hand and finger(s), initial encounter: Secondary | ICD-10-CM | POA: Diagnosis present

## 2018-11-28 DIAGNOSIS — W230XXA Caught, crushed, jammed, or pinched between moving objects, initial encounter: Secondary | ICD-10-CM | POA: Insufficient documentation

## 2018-11-28 DIAGNOSIS — Z79899 Other long term (current) drug therapy: Secondary | ICD-10-CM | POA: Diagnosis not present

## 2018-11-28 NOTE — Discharge Instructions (Addendum)
You have been diagnosed today with injury to the left ring finger.  At this time there does not appear to be the presence of an emergent medical condition, however there is always the potential for conditions to change. Please read and follow the below instructions.  Please return to the Emergency Department immediately for any new or worsening symptoms. Please be sure to follow up with your Primary Care Provider within one week regarding your visit today; please call their office to schedule an appointment even if you are feeling better for a follow-up visit. Please use rest, ice and elevation to help with your symptoms.  He may use a finger splint given to you today to help protect your finger.  If your symptoms persist you may call the hand specialist Dr. Grandville Silos in your discharge paperwork for follow-up as further imaging may be needed.  Your x-ray today was negative however ligaments, tendons or unseen fractures among other abnormalities may be present and if symptoms continue I advise you follow-up with the hand specialist Dr. Grandville Silos for further evaluation.  Get help right away if: Your bruising or swelling gets worse. Your splint or cast is damaged. You lose feeling in your finger. Your finger turns blue. Your finger feels colder than normal when you touch it. You have a fever. You have any new/concerning or worsening of symptoms  Please read the additional information packets attached to your discharge summary.  Do not take your medicine if  develop an itchy rash, swelling in your mouth or lips, or difficulty breathing; call 911 and seek immediate emergency medical attention if this occurs.  Note: Portions of this text may have been transcribed using voice recognition software. Every effort was made to ensure accuracy; however, inadvertent computerized transcription errors may still be present.

## 2018-11-28 NOTE — ED Provider Notes (Signed)
[facility]-EMERGENCY DEPT Provider Note   CSN: [id number] Arrival date & time: 11/28/18  1028     History   Chief Complaint Chief Complaint  Patient presents with  . Finger Injury    HPI Linus MakoLaneice Phillips is a 23 y.o. female presents today for pain of the left fourth finger after she had a squished inside of a pinata 6 days ago.  Patient reports that it was her birthday party and she was reaching for some money when her brother pulled a different part of the piata and her finger was caught between the 2 pieces.  Patient reports she had a mild throbbing sensation of the left proximal phalanx worsened with movement and palpation and without alleviating factor, she reports that she was wearing her engagement ring on that finger during the event and feels that wearing the ring worsens her symptoms.  She reports she has some minimal swelling that has been present since that time and that her engagement ring no longer fits well.  Patient reports that after 1-2 days her pain resolved and she only has pain when she wears her ring or squeezes her proximal phalanx.  Denies fevers/chills, headache, numbness/tingling, weakness, erythema, drainage, skin break or any additional concerns.     HPI  Past Medical History:  Diagnosis Date  . Anemia   . Blood transfusion without reported diagnosis    as an infant  . Dysmenorrhea     Patient Active Problem List   Diagnosis Date Noted  . Pelvic pain 06/08/2017  . Dysmenorrhea   . Anemia     Past Surgical History:  Procedure Laterality Date  . wisdom teeth removal       OB History    Gravida  0   Para  0   Term  0   Preterm  0   AB  0   Living  0     SAB  0   TAB  0   Ectopic  0   Multiple  0   Live Births  0            Home Medications    Prior to Admission medications   Medication Sig Start Date End Date Taking? Authorizing Provider  estradiol (ESTRACE) 0.1 MG/GM vaginal cream Apply  topically and as directed twice weekly. 10/06/18   Jerene BearsMiller, Mary S, MD  medroxyPROGESTERone (DEPO-PROVERA) 150 MG/ML injection Inject 150 mg into the muscle every 3 (three) months. 04/21/17   [provider]  metroNIDAZOLE (FLAGYL) 500 MG tablet Take 1 tablet (500 mg total) by mouth 2 (two) times daily. 10/07/18   Jerene BearsMiller, Mary S, MD  PROAIR HFA 108 [phone](90 Base) MCG/ACT inhaler Inhale 1 puff into the lungs every 6 (six) hours as needed for wheezing or shortness of breath.  11/11/17   [provider]    Family History Family History  Problem Relation Age of Onset  . Thyroid disease Mother   . Ovarian cysts Sister        half sister, aged 23 when cyst was removed    Social History Social History   Tobacco Use  . Smoking status: Never Smoker  . Smokeless tobacco: Never Used  Substance Use Topics  . Alcohol use: No  . Drug use: No     Allergies   Patient has no known allergies.   Review of Systems Review of Systems Ten systems are reviewed and are negative for acute change except as noted in the HPI  Physical Exam Updated Vital Signs BP (!) 144/88 (BP Location: Left Arm)   Pulse 75   Temp 98.1 F (36.7 C) (Oral)   Resp 16   Ht 5\' 3"  (1.6 m)   Wt 57.2 kg   SpO2 100%   BMI 22.32 kg/m   Physical Exam Constitutional:      General: She is not in acute distress.    Appearance: Normal appearance. She is well-developed. She is not ill-appearing or diaphoretic.  HENT:     Head: Normocephalic and atraumatic.     Right Ear: External ear normal.     Left Ear: External ear normal.     Nose: Nose normal.  Eyes:     General: Vision grossly intact. Gaze aligned appropriately.     Pupils: Pupils are equal, round, and reactive to light.  Neck:     Musculoskeletal: Normal range of motion.     Trachea: Trachea and phonation normal. No tracheal deviation.  Pulmonary:     Effort: Pulmonary effort is normal. No respiratory distress.  Abdominal:     General: There is  no distension.     Palpations: Abdomen is soft.     Tenderness: There is no abdominal tenderness. There is no guarding or rebound.  Musculoskeletal: Normal range of motion.     Comments: Left hand: No gross deformities, skin intact, minimal swelling around the left proximal phalanx without color change.proximal phalanx minimally tender to palpation.  Otherwise no tenderness of the left upper extremity. No snuffbox tenderness to palpation. No tenderness to palpation over flexor sheath.  Finger adduction/abduction intact with 5/5 strength.  Thumb opposition intact. Full active and resisted ROM to flexion/extension at wrist, MCP, PIP and DIP of all fingers.  FDS/FDP intact. Grip 5/5 strength.  Radial artery 2+ with <2sec cap refill in all fingers.  Sensation intact to light-tough in median/ulnar/radial distributions.  Compartments soft to palpation  Skin:    General: Skin is warm and dry.  Neurological:     Mental Status: She is alert.     GCS: GCS eye subscore is 4. GCS verbal subscore is 5. GCS motor subscore is 6.     Comments: Speech is clear and goal oriented, follows commands Major Cranial nerves without deficit, no facial droop Moves extremities without ataxia, coordination intact  Psychiatric:        Behavior: Behavior normal.      ED Treatments / Results  Labs (all labs ordered are listed, but only abnormal results are displayed) Labs Reviewed - No data to display  EKG None  Radiology Dg Finger Ring Left  Result Date: 11/28/2018 CLINICAL DATA:  Pt c/o left ring finger pain, bruising, swelling to anterior surface of proximal phalanx x 6 days s/p caught hand and engagement ring in a pinata that was then crushed by her EXAM: LEFT RING FINGER 2+V COMPARISON:  None. FINDINGS: There is no evidence of fracture or dislocation. There is no evidence of arthropathy or other focal bone abnormality. Soft tissues are unremarkable. IMPRESSION: Negative. Electronically Signed   By: Lajean Manes M.D.   On: 11/28/2018 11:27    Procedures Procedures (including critical care time)  Medications Ordered in ED Medications - No data to display   Initial Impression / Assessment and Plan / ED Course  I have reviewed the triage vital signs and the nursing notes.  Pertinent labs & imaging results that were available during my care of the patient were reviewed by me and considered in my medical  decision making (see chart for details).    DG left ring finger:  IMPRESSION:  Negative.  - Patient neurovascular intact to all fingers, good capillary refill and sensation, range of motion and strength intact.  No sign of DVT, cellulitis, septic arthritis, compartment syndrome or other acute pathologies at this time.  Suspect patient may have mild soft sprain/strain as etiology of her symptoms.  She has been given finger splint and encouraged on rice therapy, she will be given referral to orthopedics for follow-up as needed, she can also follow-up with her PCP.  At this time there does not appear to be any evidence of an acute emergency medical condition and the patient appears stable for discharge with appropriate outpatient follow up. Diagnosis was discussed with patient who verbalizes understanding of care plan and is agreeable to discharge. I have discussed return precautions with patient who verbalizes understanding of return precautions. Patient encouraged to follow-up with their PCP or ortho. All questions answered.  Patient has been discharged in good condition.   Note: Portions of this report may have been transcribed using voice recognition software. Every effort was made to ensure accuracy; however, inadvertent computerized transcription errors may still be present. Final Clinical Impressions(s) / ED Diagnoses   Final diagnoses:  Injury of finger of left hand, initial encounter    ED Discharge Orders    None       Elizabeth Palau 11/28/18 1228    Lorre Nick, MD 12/01/18 1328

## 2018-11-28 NOTE — ED Triage Notes (Signed)
Pt injured left ring finger last week, continues to be tender and she states it is swollen

## 2018-12-14 ENCOUNTER — Encounter (HOSPITAL_COMMUNITY): Payer: Self-pay | Admitting: Emergency Medicine

## 2018-12-14 ENCOUNTER — Other Ambulatory Visit: Payer: Self-pay

## 2018-12-14 ENCOUNTER — Emergency Department (HOSPITAL_COMMUNITY): Payer: BC Managed Care – PPO

## 2018-12-14 ENCOUNTER — Emergency Department (HOSPITAL_COMMUNITY)
Admission: EM | Admit: 2018-12-14 | Discharge: 2018-12-14 | Disposition: A | Discharge status: Home / Self Care | Payer: BC Managed Care – PPO | Attending: Emergency Medicine | Admitting: Emergency Medicine

## 2018-12-14 DIAGNOSIS — R5383 Other fatigue: Secondary | ICD-10-CM | POA: Insufficient documentation

## 2018-12-14 DIAGNOSIS — R1030 Lower abdominal pain, unspecified: Secondary | ICD-10-CM | POA: Insufficient documentation

## 2018-12-14 DIAGNOSIS — Z79899 Other long term (current) drug therapy: Secondary | ICD-10-CM | POA: Diagnosis not present

## 2018-12-14 DIAGNOSIS — R109 Unspecified abdominal pain: Secondary | ICD-10-CM

## 2018-12-14 LAB — COMPREHENSIVE METABOLIC PANEL
ALT: 20 U/L (ref 0–44)
AST: 21 U/L (ref 15–41)
Albumin: 4.5 g/dL (ref 3.5–5.0)
Alkaline Phosphatase: 98 U/L (ref 38–126)
Anion gap: 12 (ref 5–15)
BUN: 8 mg/dL (ref 6–20)
CO2: 19 mmol/L — ABNORMAL LOW (ref 22–32)
Calcium: 9.5 mg/dL (ref 8.9–10.3)
Chloride: 110 mmol/L (ref 98–111)
Creatinine, Ser: 0.62 mg/dL (ref 0.44–1.00)
GFR calc Af Amer: >60 mL/min (ref >60)
GFR calc non Af Amer: >60 mL/min (ref >60)
Glucose, Bld: 75 mg/dL (ref 70–99)
Potassium: 3.8 mmol/L (ref 3.5–5.1)
Sodium: 141 mmol/L (ref 135–145)
Total Bilirubin: 0.7 mg/dL (ref 0.3–1.2)
Total Protein: 8.3 g/dL — ABNORMAL HIGH (ref 6.5–8.1)

## 2018-12-14 LAB — PREGNANCY, URINE: Preg Test, Ur: NEGATIVE

## 2018-12-14 LAB — CBC WITH DIFFERENTIAL/PLATELET
Abs Immature Granulocytes: 0.03 10*3/uL (ref 0.00–0.07)
Basophils Absolute: 0.1 10*3/uL (ref 0.0–0.1)
Basophils Relative: 1 %
Eosinophils Absolute: 0.1 10*3/uL (ref 0.0–0.5)
Eosinophils Relative: 1 %
HCT: 47.9 % — ABNORMAL HIGH (ref 36.0–46.0)
Hemoglobin: 15.9 g/dL — ABNORMAL HIGH (ref 12.0–15.0)
Immature Granulocytes: 0 %
Lymphocytes Relative: 29 %
Lymphs Abs: 2.1 10*3/uL (ref 0.7–4.0)
MCH: 28.6 pg (ref 26.0–34.0)
MCHC: 33.2 g/dL (ref 30.0–36.0)
MCV: 86.3 fL (ref 80.0–100.0)
Monocytes Absolute: 0.8 10*3/uL (ref 0.1–1.0)
Monocytes Relative: 11 %
Neutro Abs: 4.2 10*3/uL (ref 1.7–7.7)
Neutrophils Relative %: 58 %
Platelets: 326 10*3/uL (ref 150–400)
RBC: 5.55 MIL/uL — ABNORMAL HIGH (ref 3.87–5.11)
RDW: 12.8 % (ref 11.5–15.5)
WBC: 7.3 10*3/uL (ref 4.0–10.5)
nRBC: 0 % (ref 0.0–0.2)

## 2018-12-14 LAB — URINALYSIS, ROUTINE W REFLEX MICROSCOPIC
Bilirubin Urine: NEGATIVE
Glucose, UA: NEGATIVE mg/dL
Ketones, ur: NEGATIVE mg/dL
Nitrite: NEGATIVE
Protein, ur: NEGATIVE mg/dL
Specific Gravity, Urine: 1.011 (ref 1.005–1.030)
pH: 7 (ref 5.0–8.0)

## 2018-12-14 LAB — LIPASE, BLOOD: Lipase: 24 U/L (ref 11–51)

## 2018-12-14 MED ORDER — IOHEXOL 300 MG/ML  SOLN
80.0000 mL | Freq: Once | INTRAMUSCULAR | Status: AC | PRN
  Administered 2018-12-14: 80 mL via INTRAVENOUS

## 2018-12-14 MED ORDER — MORPHINE SULFATE (PF) 2 MG/ML IV SOLN
2.0000 mg | Freq: Once | INTRAVENOUS | Status: AC
  Administered 2018-12-14: 20:00:00 2 mg via INTRAVENOUS
  Filled 2018-12-14: qty 1

## 2018-12-14 MED ORDER — SODIUM CHLORIDE 0.9 % IV BOLUS
500.0000 mL | Freq: Once | INTRAVENOUS | Status: AC
  Administered 2018-12-14: 20:00:00 500 mL via INTRAVENOUS

## 2018-12-14 MED ORDER — ONDANSETRON HCL 4 MG/2ML IJ SOLN
4.0000 mg | Freq: Once | INTRAMUSCULAR | Status: AC
  Administered 2018-12-14: 4 mg via INTRAVENOUS
  Filled 2018-12-14: qty 2

## 2018-12-14 NOTE — Discharge Instructions (Addendum)
You may follow-up with your primary care doctor on outpatient basis for follow-up ultrasound of your gallbladder as we discussed.  However your CT scan showed no evidence of internal trauma.  No explanation for discomfort in the abdomen.  Negative for appendicitis, diverticulitis, other emergent abdominal diagnosis.  Please take Tylenol ibuprofen for pain.  Please return if you have any new or concerning symptoms.

## 2018-12-14 NOTE — ED Triage Notes (Signed)
Pt states she hit her abd on the corner of a wall 11/13. Says it has been getting more tender since hitting it. Had a small bowel movement since hitting it on yesterday. Reports feeling fatigued, nauseous. Denies emesis or diarrhea

## 2018-12-14 NOTE — ED Provider Notes (Signed)
[facility] EMERGENCY DEPARTMENT Provider Note   CSN: [id number] Arrival date & time: 12/14/18  1732     History   Chief Complaint Chief Complaint  Patient presents with   Abdominal Pain    HPI Karen Yang is a 23 y.o. female.     HPI  Presents for left and right lower quadrant pain, epigastric pain, decreased appetite, nausea since Friday.  Patient states that on Friday she ran into the corner of a wall struck the top of her abdomen and she has had persistent pain since.  Patient endorses additional pain in her right lower quadrant that hurts to touch is associated with decreased appetite.  Patient states there is no mitigating factors but endorses worsening pain with walking and movement.  Endorses associated nausea denies any vomiting.  Denies fevers.  Denies any urinary symptoms, states that she has had 1 bowel movement today that was nonbloody or painful.    Past Medical History:  Diagnosis Date   Anemia    Blood transfusion without reported diagnosis    as an infant   Dysmenorrhea     Patient Active Problem List   Diagnosis Date Noted   Pelvic pain 06/08/2017   Dysmenorrhea    Anemia     Past Surgical History:  Procedure Laterality Date   wisdom teeth removal       OB History    Gravida  0   Para  0   Term  0   Preterm  0   AB  0   Living  0     SAB  0   TAB  0   Ectopic  0   Multiple  0   Live Births  0            Home Medications    Prior to Admission medications   Medication Sig Start Date End Date Taking? Authorizing Provider  estradiol (ESTRACE) 0.1 MG/GM vaginal cream Apply topically and as directed twice weekly. 10/06/18   Jerene Bears, MD  medroxyPROGESTERone (DEPO-PROVERA) 150 MG/ML injection Inject 150 mg into the muscle every 3 (three) months. 04/21/17   [provider]  metroNIDAZOLE (FLAGYL) 500 MG tablet Take 1 tablet (500 mg total) by mouth 2 (two) times daily. 10/07/18    Jerene Bears, MD  PROAIR HFA 108 [phone] Base) MCG/ACT inhaler Inhale 1 puff into the lungs every 6 (six) hours as needed for wheezing or shortness of breath.  11/11/17   [provider]    Family History Family History  Problem Relation Age of Onset   Thyroid disease Mother    Ovarian cysts Sister        half sister, aged 66 when cyst was removed    Social History Social History   Tobacco Use   Smoking status: Never Smoker   Smokeless tobacco: Never Used  Substance Use Topics   Alcohol use: No   Drug use: No     Allergies   Patient has no known allergies.   Review of Systems Review of Systems  Constitutional: Negative for fever.  HENT: Negative for congestion.   Respiratory: Negative for chest tightness.   Gastrointestinal: Positive for abdominal pain and nausea. Negative for diarrhea and vomiting.  Genitourinary: Negative for dysuria, frequency, hematuria, pelvic pain and urgency.  Musculoskeletal: Negative for back pain.  Skin: Negative for rash.  Neurological: Negative for dizziness.  All other systems reviewed and are negative.    Physical Exam Updated Vital  Signs BP 119/72 (BP Location: Right Arm)    Pulse 72    Temp 98.6 F (37 C) (Oral)    Resp 14    SpO2 100%   Physical Exam Vitals signs and nursing note reviewed.  Constitutional:      General: She is not in acute distress. HENT:     Head: Normocephalic and atraumatic.     Nose: Nose normal.  Eyes:     General: No scleral icterus. Neck:     Musculoskeletal: Normal range of motion.  Cardiovascular:     Rate and Rhythm: Normal rate and regular rhythm.     Pulses: Normal pulses.     Heart sounds: Normal heart sounds.  Pulmonary:     Effort: Pulmonary effort is normal. No respiratory distress.     Breath sounds: No wheezing.  Abdominal:     Palpations: Abdomen is soft.     Tenderness: There is abdominal tenderness (LLQ, RLQ, epigastric). There is guarding. There is no right CVA  tenderness or left CVA tenderness.  Musculoskeletal:     Right lower leg: No edema.     Left lower leg: No edema.  Skin:    General: Skin is warm and dry.     Capillary Refill: Capillary refill takes less than 2 seconds.  Neurological:     Mental Status: She is alert. Mental status is at baseline.  Psychiatric:        Mood and Affect: Mood normal.        Behavior: Behavior normal.      ED Treatments / Results  Labs (all labs ordered are listed, but only abnormal results are displayed) Labs Reviewed  CBC WITH DIFFERENTIAL/PLATELET - Abnormal; Notable for the following components:      Result Value   RBC 5.55 (*)    Hemoglobin 15.9 (*)    HCT 47.9 (*)    All other components within normal limits  COMPREHENSIVE METABOLIC PANEL - Abnormal; Notable for the following components:   CO2 19 (*)    Total Protein 8.3 (*)    All other components within normal limits  URINALYSIS, ROUTINE W REFLEX MICROSCOPIC - Abnormal; Notable for the following components:   Color, Urine STRAW (*)    Hgb urine dipstick SMALL (*)    Leukocytes,Ua TRACE (*)    Bacteria, UA RARE (*)    All other components within normal limits  PREGNANCY, URINE  LIPASE, BLOOD    EKG None  Radiology Ct Abdomen Pelvis W Contrast  Result Date: 12/14/2018 CLINICAL DATA:  Struck abdomen on corner of wall 12/09/2018, constipation and fatigue since the event EXAM: CT ABDOMEN AND PELVIS WITH CONTRAST TECHNIQUE: Multidetector CT imaging of the abdomen and pelvis was performed using the standard protocol following bolus administration of intravenous contrast. CONTRAST:  80mL OMNIPAQUE IOHEXOL 300 MG/ML  SOLN COMPARISON:  None. FINDINGS: Lower chest: Lung bases are clear. Normal heart size. No pericardial effusion. Hepatobiliary: No hepatic injury or perihepatic hematoma. No concerning liver abnormality. Rounded centrally hypoattenuating focus at the tip of the gallbladder. Could reflect adenomyomatosis versus prominent fold  versus gallbladder diverticulum. Gallbladder is otherwise unremarkable. No calcified gallstones or biliary ductal dilatation. Pancreas: Unremarkable. No pancreatic ductal dilatation or surrounding inflammatory changes. Spleen: No splenic injury or perisplenic hematoma. No suspicious splenic lesions. Adrenals/Urinary Tract: No adrenal hematoma or suspicious adrenal lesions. No direct renal injury or perirenal hemorrhage. Kidneys are otherwise unremarkable, without renal calculi, suspicious lesion, or hydronephrosis. Bladder is unremarkable. Stomach/Bowel: Distal esophagus, stomach  and duodenal sweep are unremarkable. No small bowel wall thickening or dilatation. No evidence of obstruction. A normal appendix is visualized. No colonic dilatation or wall thickening. No mesenteric contusion or hemorrhage. Vascular/Lymphatic: The aorta is normal caliber. No suspicious or enlarged lymph nodes in the included lymphatic chains. Reproductive: Bicornuate appearance of the uterus. No concerning adnexal lesions. Other: No free fluid or free air. No body wall contusion or hematoma. No traumatic abdominal wall hernia. No bowel containing hernias. Musculoskeletal: No acute osseous abnormality or suspicious osseous lesion. IMPRESSION: 1. No evidence of acute traumatic injury or other acute abnormality within the abdomen or pelvis. 2. Bicornuate appearance of the uterus. 3. Irregular centrally hypoattenuating nodule at the tip of the gallbladder, may reflect adenomyomatosis, a small gallbladder fold or a gallbladder diverticulum. Otherwise normal gallbladder. Could be further evaluated with nonemergent outpatient abdominal ultrasound. Electronically Signed   By: Lovena Le M.D.   On: 12/14/2018 21:23    Procedures Procedures (including critical care time)  Medications Ordered in ED Medications  ondansetron (ZOFRAN) injection 4 mg (4 mg Intravenous Given 12/14/18 1930)  morphine 2 MG/ML injection 2 mg (2 mg Intravenous  Given 12/14/18 1930)  sodium chloride 0.9 % bolus 500 mL (0 mLs Intravenous Stopped 12/14/18 2146)  iohexol (OMNIPAQUE) 300 MG/ML solution 80 mL (80 mLs Intravenous Contrast Given 12/14/18 2103)     Initial Impression / Assessment and Plan / ED Course  I have reviewed the triage vital signs and the nursing notes.  Pertinent labs & imaging results that were available during my care of the patient were reviewed by me and considered in my medical decision making (see chart for details).        Presents with right lower quadrant, left lower quadrant and umbilical abdominal pain since Friday associated with nausea and decreased appetite.  Patient did have incidental trauma to her abdomen which she associates with the onset of her symptoms however I am concerned that patient may have appendicitis that she has some symptoms of such.  Urine without strong evidence of urinary tract infection and patient has no symptoms of such, CMP without acute abnormality, CBC nonacute.  CT scan shows no acute finding does recommend outpatient follow-up ultrasound for abnormalities of gallbladder.   My reevaluation patient is improved states that she feels better and is very to go home.  Patient given strict return precautions and is understanding of such.  Discussed that CT scan can miss early appendicitis.  Patient educated on symptoms of appendicitis.   Repeat abdominal exam patient still has mild right lower quadrant tenderness to palpation.   I suspect epigastric pain is simply due to trauma to abdomen however RLQ tenderness may be related as there is no intraabdominal CT finding. This may be diffuse muscular tenderness after traumatic injury.  Doubt other acute cause of abdominal pain. Patient has no signs or historical features suggestive of GB etiology of abd pain.  Vitals within normal limits other than mildly elevated blood pressure.  Patient will follow up with primary care doctor.  Feels better  after fluids, Zofran, 2 mg of morphine.   Recommend Tylenol and ibuprofen at home. Given    This patient appears reasonably screened and I doubt any other medical condition requiring further workup, evaluation, or treatment in the ED at this time prior to discharge.   Patient's vitals are WNL apart from vital sign abnormalities discussed above, patient is in NAD, and able to ambulate in the ED at their baseline. Pain  has been managed or a plan has been made for home management and has no complaints prior to discharge. Patient is comfortable with above plan and is stable for discharge at this time. All questions were answered prior to disposition. Results from the ER workup discussed with the patient face to face and all questions answered to the best of my ability. The patient is safe for discharge with strict return precautions. Patient appears safe for discharge with appropriate follow-up. Conveyed my impression with the patient and they voiced understanding and are agreeable to plan.   An After Visit Summary was printed and given to the patient.  Portions of this note were generated with Scientist, clinical (histocompatibility and immunogenetics)Dragon dictation software. Dictation errors may occur despite best attempts at proofreading.     Final Clinical Impressions(s) / ED Diagnoses   Final diagnoses:  Abdominal pain, unspecified abdominal location    ED Discharge Orders    None       Gailen ShelterFondaw, Rhoderick Farrel S, [place] 12/15/18 0229    Terrilee FilesButler, Michael C, MD 12/15/18 [phone]

## 2019-01-02 ENCOUNTER — Telehealth: Payer: Self-pay | Admitting: Obstetrics & Gynecology

## 2019-01-02 NOTE — Telephone Encounter (Signed)
Spoke with pt. 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 Pt denies pain or problems at this time. Pt scheduled for OV consult on 01/05/19 at 0830 with Dr Sabra Heck. Pt agreeable.   Will route to Dr Sabra Heck for review and will close encounter.

## 2019-01-02 NOTE — Telephone Encounter (Signed)
Patient sent the following correspondence through [facility].  Appointment Request From: Frutoso Schatz  With Provider: Megan Salon, MD [facility]]  Preferred Date Range: Any date 01/02/2019 or later  Preferred Times: Monday Morning  Reason for visit: Request an Appointment  Comments: Had a CT with contrast done about two weeks ago for an abdominal injury at [facility] ER and it stated there is a bicornuate appearance of the uterus, I want to get a second opinion

## 2019-01-03 ENCOUNTER — Other Ambulatory Visit: Payer: Self-pay

## 2019-01-05 ENCOUNTER — Telehealth: Payer: Self-pay | Admitting: *Deleted

## 2019-01-05 ENCOUNTER — Ambulatory Visit (INDEPENDENT_AMBULATORY_CARE_PROVIDER_SITE_OTHER): Payer: BC Managed Care – PPO | Admitting: Obstetrics & Gynecology

## 2019-01-05 ENCOUNTER — Other Ambulatory Visit: Payer: Self-pay

## 2019-01-05 ENCOUNTER — Encounter: Payer: Self-pay | Admitting: Obstetrics & Gynecology

## 2019-01-05 VITALS — BP 122/64 | HR 69 | Temp 97.1°F | Ht 63.0 in | Wt 129.2 lb

## 2019-01-05 DIAGNOSIS — Q519 Congenital malformation of uterus and cervix, unspecified: Secondary | ICD-10-CM

## 2019-01-05 NOTE — Telephone Encounter (Signed)
MRI pelvis w/wo contrast placed to [facility].  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   Call placed to patient, advised order has been placed for MRI pelvis at [facility]. Advised patient she will be contacted directly by [facility] to schedule. Once scheduled, our office will precert. Patient verbalizes understanding and is agreeable.   Routing to provider for final review. Patient is agreeable to disposition. Will close encounter.   CC: Lerry Liner and [employer] 

## 2019-01-05 NOTE — Patient Instructions (Signed)
We will call with your MRI scheduling information within the next few days.

## 2019-01-05 NOTE — Progress Notes (Signed)
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  She ran into a wall at work and she had some persistent pelvic pain and had worsening pain after eating.  She was seen by PCP and had blood work and CT scan.    She stopped taking Depo provera last month.  She did have spotting a few weeks ago.  She feels much better emotionally since she stopped taking the Depo Provera.  Feels it is ok if she gets pregnant.  She is going to start a PNV  Had ultrasound done here 06/25/27.  This was done transabdominally and I have reveiwed pictures.  It is not clear on these images that there is any uterine anomaly present.    Discussed with pt the differences in uterine anomalies and relation to urologic anomalies but none were seen on CT.  Results of CT reviewed with pt.  GYNECOLOGIC HISTORY: Last LMP: patient states that she spotted a couple of weeks ago.  Contraception: none  Menopausal hormone therapy: none  Patient Active Problem List   Diagnosis Date Noted  . Pelvic pain 06/08/2017  . Dysmenorrhea   . Anemia     Past Medical History:  Diagnosis Date  . Anemia   . Blood transfusion without reported diagnosis    as an infant  . Dysmenorrhea     Past Surgical History:  Procedure Laterality Date  . wisdom teeth removal      MEDS:   Current Outpatient Medications on File Prior to Visit  Medication Sig Dispense Refill  . meloxicam (MOBIC) 15 MG tablet Take 15 mg by mouth daily.    Marland Kitchen PROAIR HFA 108 (90 Base) MCG/ACT inhaler Inhale 1 puff into the lungs every 6 (six) hours as needed for wheezing or shortness of breath.   0   No current facility-administered medications on file prior to visit.    ALLERGIES: Patient has no known allergies.  Family History  Problem Relation Age of Onset  . Thyroid disease Mother   . Ovarian cysts  Sister        half sister, aged 1 when cyst was removed    SH:  Engaged, non smoker  Review of Systems  Genitourinary: Positive for pelvic pain.  All other systems reviewed and are negative.   PHYSICAL EXAMINATION:    BP 122/64   Pulse 69   Temp (!) 97.1 F (36.2 C)   Ht 5\' 3"  (1.6 m)   Wt 129 lb 3.2 oz (58.6 kg)   SpO2 97%   BMI 22.89 kg/m     General appearance: alert, cooperative and appears stated age Abdomen: soft, non-tender; bowel sounds normal; no masses,  no organomegaly Lymph:  no inguinal LAD noted  Pelvic: External genitalia:  no lesions              Urethra:  normal appearing urethra with no masses, tenderness or lesions              Bartholins and Skenes: normal                 Vagina: normal appearing vagina with normal color and discharge, no lesions              Cervix: no lesions              Bimanual  Exam:  Uterus:  normal size, contour, position, consistency, mobility, non-tender              Adnexa: no mass, fullness, tenderness              Anus: no visible lesions   Chaperone was present for exam.  Assessment: Possible uterine anomaly noted on CT that was not seen on transabdominal ultrasound Recent cessation of depo provera with improvement in mood and sex drive.  Does not desire any additional contraception.  Plan: Will proceed with MRI of pelvis Strongly advised pt to start PNV as not using anything for contraception at this time Questions all answered   ~15 minutes spent with patient >50% of time was in face to face discussion of above.

## 2019-01-05 NOTE — Telephone Encounter (Signed)
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  Needs pelvic MRI scheduled.  Thinks she's met her deductible.    Thanks.  Vinnie Level

## 2019-01-31 ENCOUNTER — Encounter (HOSPITAL_COMMUNITY): Payer: Self-pay | Admitting: Emergency Medicine

## 2019-01-31 ENCOUNTER — Emergency Department (HOSPITAL_COMMUNITY): Payer: BC Managed Care – PPO

## 2019-01-31 ENCOUNTER — Emergency Department (HOSPITAL_COMMUNITY)
Admission: EM | Admit: 2019-01-31 | Discharge: 2019-01-31 | Disposition: A | Discharge status: Home / Self Care | Payer: BC Managed Care – PPO | Attending: Emergency Medicine | Admitting: Emergency Medicine

## 2019-01-31 ENCOUNTER — Other Ambulatory Visit: Payer: Self-pay

## 2019-01-31 DIAGNOSIS — W260XXA Contact with knife, initial encounter: Secondary | ICD-10-CM | POA: Diagnosis not present

## 2019-01-31 DIAGNOSIS — Y92000 Kitchen of unspecified non-institutional (private) residence as  the place of occurrence of the external cause: Secondary | ICD-10-CM | POA: Insufficient documentation

## 2019-01-31 DIAGNOSIS — Z79899 Other long term (current) drug therapy: Secondary | ICD-10-CM | POA: Insufficient documentation

## 2019-01-31 DIAGNOSIS — Y93G1 Activity, food preparation and clean up: Secondary | ICD-10-CM | POA: Diagnosis not present

## 2019-01-31 DIAGNOSIS — Y999 Unspecified external cause status: Secondary | ICD-10-CM | POA: Diagnosis not present

## 2019-01-31 DIAGNOSIS — S6992XA Unspecified injury of left wrist, hand and finger(s), initial encounter: Secondary | ICD-10-CM | POA: Diagnosis present

## 2019-01-31 DIAGNOSIS — Z23 Encounter for immunization: Secondary | ICD-10-CM | POA: Diagnosis not present

## 2019-01-31 DIAGNOSIS — S61211A Laceration without foreign body of left index finger without damage to nail, initial encounter: Secondary | ICD-10-CM

## 2019-01-31 DIAGNOSIS — J45909 Unspecified asthma, uncomplicated: Secondary | ICD-10-CM | POA: Insufficient documentation

## 2019-01-31 MED ORDER — ACETAMINOPHEN 325 MG PO TABS
650.0000 mg | ORAL_TABLET | Freq: Once | ORAL | Status: AC
  Administered 2019-01-31: 650 mg via ORAL
  Filled 2019-01-31: qty 2

## 2019-01-31 MED ORDER — CEPHALEXIN 500 MG PO CAPS: 500.0000 mg | ORAL_CAPSULE | Freq: Two times a day (BID) | ORAL | 0 refills | Status: AC

## 2019-01-31 MED ORDER — TETANUS-DIPHTH-ACELL PERTUSSIS 5-2.5-18.5 LF-MCG/0.5 IM SUSP
0.5000 mL | Freq: Once | INTRAMUSCULAR | Status: AC
  Administered 2019-01-31: 0.5 mL via INTRAMUSCULAR
  Filled 2019-01-31: qty 0.5

## 2019-01-31 MED ORDER — LIDOCAINE HCL (PF) 2 % IJ SOLN
5.0000 mL | Freq: Once | INTRAMUSCULAR | Status: AC
  Administered 2019-01-31: 5 mL
  Filled 2019-01-31: qty 5

## 2019-01-31 NOTE — ED Triage Notes (Signed)
Pt states she cut her finger with a knife while she was cooking. Bleeding controlled on triage, dressing in place.

## 2019-01-31 NOTE — ED Notes (Signed)
Dr. Deis at bedside.  

## 2019-01-31 NOTE — Discharge Instructions (Signed)
Keep the site completely dry with a dressing in place for the next 24 hours.  Then clean gently once daily with antibacterial soap and water and apply topical antibiotic ointment like Neosporin or Polysporin once daily.  Take the cephalexin 1 capsule twice daily for 5 days.  Monitor for any signs of infection.  See your doctor or return for expanding redness around the wound, drainage of pus, red streaking up the finger or hand or new concerns.  Call your doctor today to arrange an appointment for suture removal in 10 days.

## 2019-01-31 NOTE — ED Notes (Signed)
Hand soaking in sterile water  

## 2019-01-31 NOTE — ED Provider Notes (Signed)
[facility] DEPARTMENT Provider Note   CSN: [id number] Arrival date & time: 01/31/19  0239     History Chief Complaint  Patient presents with  . Laceration    Karen Yang is a 24 y.o. female.  24 year old female with history of dysmenorrhea and mild asthma, otherwise healthy, presents with left index finger laceration which occurred 12 hours ago.  Patient was making dinner last night and using a kitchen knife to open a package of meat.  The knife slipped and cut her left index finger.  She cleaned the area with cold water and applied a cotton pad dressing which stopped the bleeding.  She presented to the adult ED overnight.  She reports she has been otherwise well this week.  No fever cough vomiting or diarrhea.  No known exposures to anyone with COVID-19.  She believes her last tetanus booster was in 2011.  The history is provided by the patient.  Laceration      Past Medical History:  Diagnosis Date  . Anemia   . Blood transfusion without reported diagnosis    as an infant  . Dysmenorrhea     Patient Active Problem List   Diagnosis Date Noted  . Pelvic pain 06/08/2017  . Dysmenorrhea   . Anemia     Past Surgical History:  Procedure Laterality Date  . wisdom teeth removal       OB History    Gravida  0   Para  0   Term  0   Preterm  0   AB  0   Living  0     SAB  0   TAB  0   Ectopic  0   Multiple  0   Live Births  0           Family History  Problem Relation Age of Onset  . Thyroid disease Mother   . Ovarian cysts Sister        half sister, aged 13 when cyst was removed    Social History   Tobacco Use  . Smoking status: Never Smoker  . Smokeless tobacco: Never Used  Substance Use Topics  . Alcohol use: No  . Drug use: No    Home Medications Prior to Admission medications   Medication Sig Start Date End Date Taking? Authorizing Provider  cephALEXin (KEFLEX) 500 MG capsule Take 1 capsule (500  mg total) by mouth 2 (two) times daily for 5 days. 01/31/19 02/05/19  Harlene Salts, MD  meloxicam (MOBIC) 15 MG tablet Take 15 mg by mouth daily. 01/02/19   [provider]  PROAIR HFA 108 (90 Base) MCG/ACT inhaler Inhale 1 puff into the lungs every 6 (six) hours as needed for wheezing or shortness of breath.  11/11/17   [provider]    Allergies    Patient has no known allergies.  Review of Systems   Review of Systems  All systems reviewed and were reviewed and were negative except as stated in the HPI  Physical Exam Updated Vital Signs BP 128/78 (BP Location: Left Arm)   Pulse 69   Temp 98.5 F (36.9 C) (Oral)   Resp 16   Ht 5\' 3"  (1.6 m)   Wt 58.1 kg   SpO2 99%   BMI 22.67 kg/m   Physical Exam Vitals and nursing note reviewed.  Constitutional:      General: She is not in acute distress.    Appearance: She is well-developed.  HENT:  Head: Normocephalic and atraumatic.     Mouth/Throat:     Mouth: Mucous membranes are moist.  Eyes:     Conjunctiva/sclera: Conjunctivae normal.     Pupils: Pupils are equal, round, and reactive to light.  Cardiovascular:     Rate and Rhythm: Normal rate and regular rhythm.     Heart sounds: Normal heart sounds. No murmur. No friction rub. No gallop.   Pulmonary:     Effort: Pulmonary effort is normal. No respiratory distress.     Breath sounds: No wheezing or rales.  Abdominal:     General: Bowel sounds are normal.     Palpations: Abdomen is soft.     Tenderness: There is no abdominal tenderness. There is no guarding or rebound.  Musculoskeletal:        General: Tenderness present. Normal range of motion.     Cervical back: Normal range of motion and neck supple.     Comments: Cotton pad adherent to laceration on fingerpad of left index finger  Skin:    General: Skin is warm and dry.     Capillary Refill: Capillary refill takes less than 2 seconds.     Findings: No rash.  Neurological:     General: No focal  deficit present.     Mental Status: She is alert and oriented to person, place, and time.     Cranial Nerves: No cranial nerve deficit.     Gait: Gait normal.     Comments: Normal strength 5/5 in upper and lower extremities, normal coordination     ED Results / Procedures / Treatments   Labs (all labs ordered are listed, but only abnormal results are displayed) Labs Reviewed - No data to display  EKG None  Radiology DG Hand Complete Left  Result Date: 01/31/2019 CLINICAL DATA:  24 year old female with laceration of the left index finger. EXAM: LEFT HAND - COMPLETE 3+ VIEW COMPARISON:  None. FINDINGS: There is no acute fracture or dislocation. The bones are well mineralized. No arthritic changes. There is laceration of the skin of the distal second digit. No radiopaque foreign object or soft tissue gas. IMPRESSION: 1. No acute fracture or dislocation. 2. Laceration of the skin of the distal second digit. No radiopaque foreign object. Electronically Signed   By: Elgie Collard M.D.   On: 01/31/2019 03:36    Procedures .Marland KitchenLaceration Repair  Date/Time: 01/31/2019 8:55 AM Performed by: Ree Shay, MD Authorized by: Ree Shay, MD   Consent:    Consent obtained:  Verbal   Consent given by:  Patient   Risks discussed:  Infection and pain   Alternatives discussed:  No treatment Anesthesia (see MAR for exact dosages):    Anesthesia method:  Nerve block   Block needle gauge:  25 G   Block anesthetic:  Lidocaine 2% w/o epi   Block technique:  Digital block   Block injection procedure:  Negative aspiration for blood, introduced needle, anatomic landmarks identified and incremental injection   Block outcome:  Anesthesia achieved Laceration details:    Location:  Finger   Finger location:  L index finger   Length (cm):  1.5   Depth (mm):  1 Repair type:    Repair type:  Simple Pre-procedure details:    Preparation:  Patient was prepped and draped in usual sterile fashion and  imaging obtained to evaluate for foreign bodies Exploration:    Hemostasis achieved with:  Direct pressure   Wound exploration: wound explored through full range of  motion and entire depth of wound probed and visualized     Wound extent: no foreign bodies/material noted, no muscle damage noted, no tendon damage noted, no underlying fracture noted and no vascular damage noted     Contaminated: no   Treatment:    Area cleansed with:  Saline and Betadine   Amount of cleaning:  Standard   Irrigation solution:  Sterile saline   Irrigation volume:  200   Irrigation method:  Syringe Skin repair:    Repair method:  Sutures   Suture size:  5-0   Suture material:  Prolene   Suture technique:  Simple interrupted   Number of sutures:  3 Approximation:    Approximation:  Close Post-procedure details:    Dressing:  Antibiotic ointment and adhesive bandage   Patient tolerance of procedure:  Tolerated well, no immediate complications   (including critical care time)  Medications Ordered in ED Medications  acetaminophen (TYLENOL) tablet 650 mg (has no administration in time range)  Tdap (BOOSTRIX) injection 0.5 mL (0.5 mLs Intramuscular Given 01/31/19 0820)  lidocaine (XYLOCAINE) 2 % injection 5 mL (5 mLs Infiltration Given by Other 01/31/19 0820)    ED Course  I have reviewed the triage vital signs and the nursing notes.  Pertinent labs & imaging results that were available during my care of the patient were reviewed by me and considered in my medical decision making (see chart for details).    MDM Rules/Calculators/A&P                      24 year old female with history of asthma and dysmenorrhea presents with laceration to the finger pad of left index finger which occurred 12 hours ago while using a kitchen knife to open a package of meat.  Patient presented to the adult ED overnight but due to prolonged wait times in the adult ED was unable to be seen, brought to the pediatric ED for  treatment this morning.  She did have x-ray of the left hand last night which showed no evidence of foreign body or underlying fracture.  On exam here vitals normal and overall well-appearing.  Heart and lung exam normal.  There is a gauze pad which is firmly attached to the left index finger with dried blood.  We will need to soak the finger in order to remove the adherent cotton pad so we can inspect the laceration.  Anticipate she will need repair and digital block.  Will order tetanus booster.  Patient tolerated laceration repair well after digital block.  3 Prolene 5-0 sutures used with good approximation of wound edges.  Bacitracin and dressing applied.  Wound care reviewed.  Will give dose of Tylenol prior to discharge.  Advised suture removal by PCP in 10 days.  Return precautions as outlined the discharge instructions.   Final Clinical Impression(s) / ED Diagnoses Final diagnoses:  Laceration of left index finger without foreign body without damage to nail, initial encounter    Rx / DC Orders ED Discharge Orders         Ordered    cephALEXin (KEFLEX) 500 MG capsule  2 times daily     01/31/19 2836           Ree Shay, MD 01/31/19 [phone]

## 2019-02-18 ENCOUNTER — Other Ambulatory Visit: Payer: BC Managed Care – PPO

## 2019-03-01 ENCOUNTER — Other Ambulatory Visit: Payer: Self-pay

## 2019-03-01 ENCOUNTER — Telehealth: Payer: BC Managed Care – PPO | Admitting: Physician Assistant

## 2019-03-01 ENCOUNTER — Emergency Department (HOSPITAL_COMMUNITY)
Admission: EM | Admit: 2019-03-01 | Discharge: 2019-03-01 | Disposition: A | Discharge status: Home / Self Care | Payer: BC Managed Care – PPO | Attending: Emergency Medicine | Admitting: Emergency Medicine

## 2019-03-01 DIAGNOSIS — Z20822 Contact with and (suspected) exposure to covid-19: Secondary | ICD-10-CM | POA: Insufficient documentation

## 2019-03-01 DIAGNOSIS — Z79899 Other long term (current) drug therapy: Secondary | ICD-10-CM | POA: Insufficient documentation

## 2019-03-01 DIAGNOSIS — J029 Acute pharyngitis, unspecified: Secondary | ICD-10-CM | POA: Diagnosis present

## 2019-03-01 DIAGNOSIS — R509 Fever, unspecified: Secondary | ICD-10-CM

## 2019-03-01 LAB — GROUP A STREP BY PCR: Group A Strep by PCR: NOT DETECTED

## 2019-03-01 MED ORDER — AMOXICILLIN 500 MG PO CAPS: 500.0000 mg | ORAL_CAPSULE | Freq: Two times a day (BID) | ORAL | 0 refills | Status: AC

## 2019-03-01 MED ORDER — DEXAMETHASONE SODIUM PHOSPHATE 10 MG/ML IJ SOLN
10.0000 mg | Freq: Once | INTRAMUSCULAR | Status: AC
  Administered 2019-03-01: 18:00:00 10 mg via INTRAMUSCULAR
  Filled 2019-03-01: qty 1

## 2019-03-01 MED ORDER — ACETAMINOPHEN 500 MG PO TABS
1000.0000 mg | ORAL_TABLET | Freq: Once | ORAL | Status: AC
  Administered 2019-03-01: 1000 mg via ORAL
  Filled 2019-03-01: qty 2

## 2019-03-01 MED ORDER — LIDOCAINE VISCOUS HCL 2 % MT SOLN
15.0000 mL | Freq: Once | OROMUCOSAL | Status: AC
  Administered 2019-03-01: 15 mL via OROMUCOSAL
  Filled 2019-03-01: qty 15

## 2019-03-01 MED ORDER — IBUPROFEN 400 MG PO TABS
600.0000 mg | ORAL_TABLET | Freq: Once | ORAL | Status: AC
  Administered 2019-03-01: 600 mg via ORAL
  Filled 2019-03-01: qty 1

## 2019-03-01 NOTE — Progress Notes (Signed)
Based on what you shared with me, I feel your condition warrants further evaluation and I recommend that you be seen for a face to face office visit.  Given your fever and neck stiffness, I think you would benefit from face-to-face evaluation at an urgent care or with your PCP today. One benefit is that they can test for strep and COVID, which I cannot do virtually. If your strep test is positive, you will need treatment with antibiotics. If strep test is negative, then you may require alternative treatments for your symptoms.     NOTE: If you entered your credit card information for this eVisit, you will not be charged. You may see a "hold" on your card for the $35 but that hold will drop off and you will not have a charge processed.   If you are having a true medical emergency please call 911.      For an urgent face to face visit, [facility] has five urgent care centers for your convenience:      NEW:  [facility] at [facility] Directions [phone] [address] [place], [place] [address] . 10 am - 6pm Monday - Friday    [facility]) Get Driving Directions [phone] [address] [place], [place] [address] . 10 am to 8 pm Monday-Friday . 12 pm to 8 pm [facility] Urgent Care at [facility] Get Driving Directions [phone] [address], [address] [place], [place] [address] . 8 am to 8 pm Monday-Friday . 9 am to 6 pm Saturday . 11 am to 6 pm Sunday     [facility] Urgent Care at [facility] Get Driving Directions  [phone] [address] [place], [place] [address] . 8 am to 8 pm Monday-Friday . 8 am to 4 pm [facility] Urgent Care at [facility] Directions [phone] [address] [place], [place] [address] . 12 pm to 6 pm Monday-Friday      Your e-visit answers were reviewed by a board  certified advanced clinical practitioner to complete your personal care plan.  Thank you for using e-Visits.   Greater than 5 minutes, yet less than 10 minutes of time have been spent researching, coordinating, and implementing care for this patient today.

## 2019-03-01 NOTE — ED Notes (Signed)
Patient verbalizes understanding of discharge instructions. Opportunity for questioning and answers were provided. Armband removed by staff, pt discharged from ED ambulatory to home.  

## 2019-03-01 NOTE — Discharge Instructions (Signed)
Your strep test was negative today and you have a Covid test pending, you should quarantine at home until you receive your results.  Sometimes sore throat can be caused by a virus, continue to treat your symptoms supportively with ibuprofen 600 mg every 6 hours, Tylenol 1000 mg every 6 hours, you can also use over-the-counter's epical throat lozenges.  If your Covid test returns negative and your sore throat is not improving in 2-3 days you can fill and begin taking the amoxicillin prescription provided to you today.  If your symptoms are still not improving please follow-up with your primary care doctor.  Return to the ED if you have worsening sore throat, difficulty swallowing or breathing, persistent high fevers or any other new or concerning symptoms.

## 2019-03-01 NOTE — ED Provider Notes (Signed)
[facility] EMERGENCY DEPARTMENT Provider Note   CSN: [id number] Arrival date & time: 03/01/19  1744     History Chief Complaint  Patient presents with  . Sore Throat    Karen Yang is a 24 y.o. female.  Karen Yang is a 24 y.o. female with history of anemia, who presents to the ED for evaluation of sore throat.  Symptoms have been present for 3 days.  She reports a constant dull ache in her throat that is worse on the left side.  She also reports some soreness over her lymph nodes.  She has had fevers up to 102 at home.  Reports associated headache and some body aches but denies any associated rhinorrhea, nasal congestion, ear pain, cough, chest pain or shortness of breath.  Denies any abdominal pain, nausea or vomiting.  States that is been very painful to swallow so she is been trying to drink liquids but has not had anything to eat today or yesterday.  She has not taken any medications for her symptoms today prior to arrival.  She denies any known sick contacts, but does work at [employer], none of her coworkers have been out with Covid.        Past Medical History:  Diagnosis Date  . Anemia   . Blood transfusion without reported diagnosis    as an infant  . Dysmenorrhea     Patient Active Problem List   Diagnosis Date Noted  . Pelvic pain 06/08/2017  . Dysmenorrhea   . Anemia     Past Surgical History:  Procedure Laterality Date  . wisdom teeth removal       OB History    Gravida  0   Para  0   Term  0   Preterm  0   AB  0   Living  0     SAB  0   TAB  0   Ectopic  0   Multiple  0   Live Births  0           Family History  Problem Relation Age of Onset  . Thyroid disease Mother   . Ovarian cysts Sister        half sister, aged 32 when cyst was removed    Social History   Tobacco Use  . Smoking status: Never Smoker  . Smokeless tobacco: Never Used  Substance Use Topics  . Alcohol use: No  . Drug  use: No    Home Medications Prior to Admission medications   Medication Sig Start Date End Date Taking? Authorizing Provider  amoxicillin (AMOXIL) 500 MG capsule Take 1 capsule (500 mg total) by mouth 2 (two) times daily for 10 days. 03/01/19 03/11/19  Dartha Lodge, PA-C  meloxicam (MOBIC) 15 MG tablet Take 15 mg by mouth daily. 01/02/19   [provider]  PROAIR HFA 108 (90 Base) MCG/ACT inhaler Inhale 1 puff into the lungs every 6 (six) hours as needed for wheezing or shortness of breath.  11/11/17   [provider]    Allergies    Patient has no known allergies.  Review of Systems   Review of Systems  Constitutional: Positive for chills and fever.  HENT: Positive for sore throat and trouble swallowing. Negative for congestion, drooling, ear pain and rhinorrhea.   Respiratory: Negative for cough and shortness of breath.   Cardiovascular: Negative for chest pain.  Gastrointestinal: Negative for abdominal pain, diarrhea, nausea and vomiting.  Genitourinary:  Negative for dysuria and frequency.  Musculoskeletal: Negative for arthralgias, myalgias, neck pain and neck stiffness.  Skin: Negative for color change and rash.  Neurological: Negative for weakness and numbness.    Physical Exam Updated Vital Signs BP 123/75   Pulse (!) 103   Temp 98.3 F (36.8 C) (Oral)   Ht 5\' 3"  (1.6 m)   Wt 58.5 kg   SpO2 99%   BMI 22.85 kg/m   Physical Exam Vitals and nursing note reviewed.  Constitutional:      General: She is not in acute distress.    Appearance: She is well-developed. She is not ill-appearing or diaphoretic.  HENT:     Head: Normocephalic and atraumatic.     Nose: No congestion or rhinorrhea.     Mouth/Throat:     Mouth: Mucous membranes are moist.     Pharynx: Uvula midline. Pharyngeal swelling and posterior oropharyngeal erythema present.     Tonsils: No tonsillar abscesses. 1+ on the right. 1+ on the left.     Comments: Posterior oropharynx is  erythematous with mild bilateral tonsillar edema and no exudates noted, uvula is midline, no signs of PTA.  Tolerating secretions and able to speak without difficulty. Eyes:     General:        Right eye: No discharge.        Left eye: No discharge.  Neck:     Comments: Tender cervical lymphadenopathy noted, no rigidity Pulmonary:     Effort: Pulmonary effort is normal. No respiratory distress.     Breath sounds: Normal breath sounds.     Comments: Respirations equal and unlabored, patient able to speak in full sentences, lungs clear to auscultation bilaterally Abdominal:     General: Bowel sounds are normal.     Palpations: Abdomen is soft.     Tenderness: There is no abdominal tenderness. There is no guarding.  Lymphadenopathy:     Cervical: Cervical adenopathy present.  Skin:    General: Skin is warm and dry.  Neurological:     Mental Status: She is alert.     Coordination: Coordination normal.  Psychiatric:        Mood and Affect: Mood normal.        Behavior: Behavior normal.     ED Results / Procedures / Treatments   Labs (all labs ordered are listed, but only abnormal results are displayed) Labs Reviewed  GROUP A STREP BY PCR  NOVEL CORONAVIRUS, NAA (HOSP ORDER, SEND-OUT TO REF LAB; TAT 18-24 HRS)    EKG None  Radiology No results found.  Procedures Procedures (including critical care time)  Medications Ordered in ED Medications  ibuprofen (ADVIL) tablet 600 mg (600 mg Oral Given 03/01/19 1823)  acetaminophen (TYLENOL) tablet 1,000 mg (1,000 mg Oral Given 03/01/19 1823)  lidocaine (XYLOCAINE) 2 % viscous mouth solution 15 mL (15 mLs Mouth/Throat Given 03/01/19 1824)  dexamethasone (DECADRON) injection 10 mg (10 mg Intramuscular Given 03/01/19 1825)    ED Course  I have reviewed the triage vital signs and the nursing notes.  Pertinent labs & imaging results that were available during my care of the patient were reviewed by me and considered in my medical decision  making (see chart for details).    MDM Rules/Calculators/A&P                     24 year old female presents with 3 days of sore throat, reported fevers at home, but afebrile here, tachycardia resolved with  treatment of pain, patient is very anxious on arrival, sore throat with erythema and edema but no exudates noted, uvula is midline and exam is not concerning for PTA.  Patient is tolerating secretions and is able to speak with normal phonation.  After treatment with ibuprofen, Tylenol, decadron and viscous lidocaine patient is able to eat and drink without difficulty.  Patient's strep PCR is negative, but I swabbed the patient personally and it was very difficult due to patient cooperation.  Will send Covid test, if symptoms or not improving in 2 to 3 days, amoxicillin prescription provided and patient instructed to fill this if COVID is negative and symptoms are not improving.  Stressed the importance of oral hydration, encouraged symptomatic treatment with Tylenol, NSAIDs and's epical throat lozenges.  Patient discharged home in good condition.  Karen Yang was evaluated in Emergency Department on 03/04/2019 for the symptoms described in the history of present illness. She was evaluated in the context of the global COVID-19 pandemic, which necessitated consideration that the patient might be at risk for infection with the SARS-CoV-2 virus that causes COVID-19. Institutional protocols and algorithms that pertain to the evaluation of patients at risk for COVID-19 are in a state of rapid change based on information released by regulatory bodies including the CDC and federal and state organizations. These policies and algorithms were followed during the patient's care in the ED.  Final Clinical Impression(s) / ED Diagnoses Final diagnoses:  Sore throat    Rx / DC Orders ED Discharge Orders         Ordered    amoxicillin (AMOXIL) 500 MG capsule  2 times daily     03/01/19 1936             Janet Berlin 03/04/19 1023    Virgel Manifold, MD 03/05/19 1250

## 2019-03-01 NOTE — ED Triage Notes (Signed)
Pt came POV thinking she has Strep. Trouble Swallowing and Sore Throat for a few days.  Low intake, holding fluids but little food intake.   Also febrile.

## 2019-03-03 LAB — NOVEL CORONAVIRUS, NAA (HOSP ORDER, SEND-OUT TO REF LAB; TAT 18-24 HRS): SARS-CoV-2, NAA: NOT DETECTED

## 2019-03-07 ENCOUNTER — Other Ambulatory Visit: Payer: Self-pay

## 2019-03-07 ENCOUNTER — Emergency Department (HOSPITAL_COMMUNITY)
Admission: EM | Admit: 2019-03-07 | Discharge: 2019-03-07 | Disposition: A | Discharge status: Home / Self Care | Payer: BC Managed Care – PPO | Attending: Emergency Medicine | Admitting: Emergency Medicine

## 2019-03-07 ENCOUNTER — Encounter (HOSPITAL_COMMUNITY): Payer: Self-pay | Admitting: Emergency Medicine

## 2019-03-07 ENCOUNTER — Emergency Department (HOSPITAL_COMMUNITY): Payer: BC Managed Care – PPO

## 2019-03-07 DIAGNOSIS — S63602A Unspecified sprain of left thumb, initial encounter: Secondary | ICD-10-CM

## 2019-03-07 DIAGNOSIS — Y999 Unspecified external cause status: Secondary | ICD-10-CM | POA: Diagnosis not present

## 2019-03-07 DIAGNOSIS — M79631 Pain in right forearm: Secondary | ICD-10-CM | POA: Diagnosis not present

## 2019-03-07 DIAGNOSIS — Y929 Unspecified place or not applicable: Secondary | ICD-10-CM | POA: Diagnosis not present

## 2019-03-07 DIAGNOSIS — W2210XA Striking against or struck by unspecified automobile airbag, initial encounter: Secondary | ICD-10-CM | POA: Diagnosis not present

## 2019-03-07 DIAGNOSIS — Y939 Activity, unspecified: Secondary | ICD-10-CM | POA: Insufficient documentation

## 2019-03-07 DIAGNOSIS — Z793 Long term (current) use of hormonal contraceptives: Secondary | ICD-10-CM | POA: Diagnosis not present

## 2019-03-07 DIAGNOSIS — S6992XA Unspecified injury of left wrist, hand and finger(s), initial encounter: Secondary | ICD-10-CM | POA: Diagnosis present

## 2019-03-07 MED ORDER — IBUPROFEN 800 MG PO TABS: 800.0000 mg | ORAL_TABLET | Freq: Three times a day (TID) | ORAL | 0 refills | Status: DC
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
## 2019-04-03 ENCOUNTER — Telehealth: Payer: Self-pay | Admitting: *Deleted

## 2019-04-03 NOTE — Telephone Encounter (Signed)
Patient is in IMG hold for MRI pelvis w/wo contrast. Dx: Uterine anomaly. Not scheduled to date.   Call to patient. Patient declines to schedule at this time. Patient states she will revisit the recommendations in a couple of years when she decides to start planning for pregnancy. Advised patient I will update Dr. Hyacinth Meeker and return call to advise if any additional recommendations. Patient agreeable.   Dr. Hyacinth Meeker -ok to remove from IMG hold and d/c order?

## 2019-04-05 NOTE — Telephone Encounter (Signed)
Yes, ok to remove from imaging hold and d/c order.  Pt is aware of recommendations.

## 2019-04-06 NOTE — Telephone Encounter (Signed)
Patient removed from IMG hold. Order discontinued.   Encounter closed.

## 2019-09-13 ENCOUNTER — Telehealth: Payer: BC Managed Care – PPO | Admitting: Family

## 2019-09-13 DIAGNOSIS — U071 COVID-19: Secondary | ICD-10-CM

## 2019-09-13 MED ORDER — BENZONATATE 100 MG PO CAPS: 100.0000 mg | ORAL_CAPSULE | Freq: Three times a day (TID) | ORAL | 0 refills | Status: DC | PRN

## 2019-09-13 NOTE — Progress Notes (Signed)
We are sorry you are not feeling well. We are here to help!  You have tested positive for COVID-19, meaning that you were infected with the novel coronavirus and could give the germ to others.    You have been enrolled in MyChart Home Monitoring for COVID-19. Daily you will receive a questionnaire within the MyChart website. Our COVID-19 response team will be monitoring your responses daily.  Please continue isolation at home, for at least 10 days since the start of your symptoms and until you have had 24 hours with no fever (without taking a fever reducer) and with improving of symptoms.  Please continue good preventive care measures, including:  frequent hand-washing, avoid touching your face, cover coughs/sneezes, stay out of crowds and keep a 6 foot distance from others.  Follow up with your provider or go to the nearest hospital ED for re-assessment if fever/cough/breathlessness return.  The following symptoms may appear 2-14 days after exposure: . Fever . Cough . Shortness of breath or difficulty breathing . Chills . Repeated shaking with chills . Muscle pain . Headache . Sore throat . New loss of taste or smell . Fatigue . Congestion or runny nose . Nausea or vomiting . Diarrhea  Go to the nearest hospital ED for assessment if fever/cough/breathlessness are severe or illness seems like a threat to life.  It is vitally important that if you feel that you have an infection such as this virus or any other virus that you stay home and away from places where you may spread it to others.  You should avoid contact with people age 67 and older.   You can use medication such as A prescription cough medication called Tessalon Perles 100 mg. You may take 1-2 capsules every 8 hours as needed for cough.  You need to quarantine for 10 days from the start of your symptoms. No need to take another test, as it can be positive for weeks after.   You may also take acetaminophen (Tylenol) as needed  for fever.  Reduce your risk of any infection by using the same precautions used for avoiding the common cold or flu:  Marland Kitchen Wash your hands often with soap and warm water for at least 20 seconds.  If soap and water are not readily available, use an alcohol-based hand sanitizer with at least 60% alcohol.  . If coughing or sneezing, cover your mouth and nose by coughing or sneezing into the elbow areas of your shirt or coat, into a tissue or into your sleeve (not your hands). . Avoid shaking hands with others and consider head nods or verbal greetings only. . Avoid touching your eyes, nose, or mouth with unwashed hands.  . Avoid close contact with people who are sick. . Avoid places or events with large numbers of people in one location, like concerts or sporting events. . Carefully consider travel plans you have or are making. . If you are planning any travel outside or inside the [place], visit the CDC's Travelers' Health webpage for the latest health notices. . If you have some symptoms but not all symptoms, continue to monitor at home and seek medical attention if your symptoms worsen. . If you are having a medical emergency, call 911.  HOME CARE . Only take medications as instructed by your medical team. . Drink plenty of fluids and get plenty of rest. . A steam or ultrasonic humidifier can help if you have congestion.   GET HELP RIGHT AWAY IF YOU HAVE  EMERGENCY WARNING SIGNS** FOR COVID-19. If you or someone is showing any of these signs seek emergency medical care immediately. Call 911 or proceed to your closest emergency facility if: . You develop worsening high fever. . Trouble breathing . Bluish lips or face . Persistent pain or pressure in the chest . New confusion . Inability to wake or stay awake . You cough up blood. . Your symptoms become more severe  **This list is not all possible symptoms. Contact your medical provider for any symptoms that are sever or concerning to you.  MAKE  SURE YOU   Understand these instructions.  Will watch your condition.  Will get help right away if you are not doing well or get worse.  Your e-visit answers were reviewed by a board certified advanced clinical practitioner to complete your personal care plan.  Depending on the condition, your plan could have included both over the counter or prescription medications.  If there is a problem please reply once you have received a response from your provider.  Your safety is important to [place].  If you have drug allergies check your prescription carefully.    You can use MyChart to ask questions about today's visit, request a non-urgent call back, or ask for a work or school excuse for 24 hours related to this e-Visit. If it has been greater than 24 hours you will need to follow up with your provider, or enter a new e-Visit to address those concerns. You will get an e-mail in the next two days asking about your experience.  I hope that your e-visit has been valuable and will speed your recovery. Thank you for using e-visits.     Approximately 5 minutes was spent documenting and reviewing patient's chart.

## 2019-10-13 ENCOUNTER — Telehealth: Payer: Self-pay | Admitting: Obstetrics & Gynecology

## 2019-10-13 NOTE — Progress Notes (Signed)
GYNECOLOGY  VISIT  CC:   Home UPTs  HPI: 24 y.o. G0P0000 Married White or Caucasian female here for changes in menstrual cycles.  Last cycle was 09/01/2019.  This was a day before her wedding.  She then had some bleeding last week but it was very light and lasted only a few days.  She used some mini pads only.  She's had two faint positive home UPTs.  She is having some mild nausea and some cramping.  She smelled a burger last week and it gave her nausea.    She stopped Depo Provera about a year ago.  She's intermittently taken OCPs during the past year.  She gets this Nurx.  She reports she does miss pills from time to time.  If got pregnant, she would be ok with this.    She got married a month ago.  She and multiple people at the wedding were diagnosed with Covid.  She's been out of work for almost a month.  She is feeling better and feels she didn't have a "bad" case but she's had a lot of fatigue.  She did start a UPT.  upt-neg today  GYNECOLOGIC HISTORY: Patient's last menstrual period was 09/01/2019. Contraception: none Menopausal hormone therapy: none  Patient Active Problem List   Diagnosis Date Noted  . Pelvic pain 06/08/2017  . Dysmenorrhea   . Anemia     Past Medical History:  Diagnosis Date  . Anemia   . Blood transfusion without reported diagnosis    as an infant  . Dysmenorrhea     Past Surgical History:  Procedure Laterality Date  . wisdom teeth removal      MEDS:   Current Outpatient Medications on File Prior to Visit  Medication Sig Dispense Refill  . ALBUTEROL IN Inhale into the lungs.    Marland Kitchen ibuprofen (ADVIL) 800 MG tablet Take 1 tablet (800 mg total) by mouth 3 (three) times daily. 21 tablet 0   No current facility-administered medications on file prior to visit.    ALLERGIES: Patient has no known allergies.  Family History  Problem Relation Age of Onset  . Thyroid disease Mother   . Ovarian cysts Sister        half sister, aged 5 when cyst was  removed    SH:  Married, non smoker  Review of Systems  Constitutional: Negative.   HENT: Negative.   Eyes: Negative.   Respiratory: Negative.   Cardiovascular: Negative.   Gastrointestinal: Negative.   Endocrine: Negative.   Genitourinary: Negative.   Musculoskeletal: Negative.   Skin: Negative.   Allergic/Immunologic: Negative.   Neurological: Negative.   Hematological: Negative.   Psychiatric/Behavioral: Negative.     PHYSICAL EXAMINATION:    BP 110/64   Pulse 68   Resp 16   Wt 113 lb (51.3 kg)   LMP 09/01/2019   BMI 20.02 kg/m     General appearance: alert, cooperative and appears stated age No other exam performed today   Assessment: 2 home UPTs and irregular bleeding  Plan: Quant HCG obtained today.  Blood type obtained today.

## 2019-10-13 NOTE — Telephone Encounter (Signed)
Appointment Request From: Linus Mako    With Provider: Jerene Bears, MD [facility]]    Preferred Date Range: 10/13/2019 - 10/16/2019    Preferred Times: Any Time    Reason for visit: Request an Appointment    Comments:  I took 2 pregnancy tests and both showed a faint line. I think I may be pregnant. Before taking those tests I was experiencing come and go nausea, at work I would get nausea at the smell of a burger or a chicken sandwich. Some abdominal cramps.

## 2019-10-13 NOTE — Telephone Encounter (Signed)
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 Pt states took home UPT x 2 this week and were positive with faint line. Pt states has been having intermittent mild nausea and having her sense of smell off. Denies NVD, abd pain or cramps. Had mild abd cramping with bleeding for 5 days on 9/4-9/8. States was light and changed light pad once a day.  Pt states LMP 09/01/19, but was taking OCPs. Pt states missed taking a couple of days on 8/17 and 8/20 when started new pack of pills on 8/14. Pt states newly married on 09/02/19 and has been SA. States made up missed pills by taking 2 on 8/18 and 8/20. Pt then states stopped taking OCPs on 09/18/19. Pt worried about being pregnant and that's why she stopped taking pills. States mother lost first baby due to taking birth control pills due to unaware of pregnancy.   Advised pt to have OV for further evaluation. Pt agreeable.  Pt scheduled with first available provider on 10/16/19 at 8 am with Dr Hyacinth Meeker. Pt agreeable to date and time of appt. Pt advised to start taking PNV and to go to ER with any heavy bleeding or clots or severe abd pain over weekend. Pt agreeable.  Routing to Dr Hyacinth Meeker for review  Encounter closed.

## 2019-10-16 ENCOUNTER — Other Ambulatory Visit: Payer: Self-pay

## 2019-10-16 ENCOUNTER — Encounter: Payer: Self-pay | Admitting: Obstetrics & Gynecology

## 2019-10-16 ENCOUNTER — Ambulatory Visit: Payer: Medicaid Other | Admitting: Obstetrics & Gynecology

## 2019-10-16 VITALS — BP 110/64 | HR 68 | Resp 16 | Wt 113.0 lb

## 2019-10-16 DIAGNOSIS — N912 Amenorrhea, unspecified: Secondary | ICD-10-CM

## 2019-10-16 DIAGNOSIS — Z3201 Encounter for pregnancy test, result positive: Secondary | ICD-10-CM

## 2019-10-16 LAB — BETA HCG QUANT (REF LAB): hCG Quant: <1 m[IU]/mL

## 2019-10-16 LAB — POCT URINE PREGNANCY: Preg Test, Ur: NEGATIVE

## 2019-10-16 MED ORDER — LEVONORGESTREL-ETHINYL ESTRAD 0.1-20 MG-MCG PO TABS: 1.0000 | ORAL_TABLET | Freq: Every day | ORAL | 12 refills | Status: DC

## 2019-10-22 ENCOUNTER — Other Ambulatory Visit: Payer: Self-pay

## 2019-10-22 DIAGNOSIS — W231XXA Caught, crushed, jammed, or pinched between stationary objects, initial encounter: Secondary | ICD-10-CM | POA: Insufficient documentation

## 2019-10-22 DIAGNOSIS — S60943A Unspecified superficial injury of left middle finger, initial encounter: Secondary | ICD-10-CM | POA: Diagnosis present

## 2019-10-23 ENCOUNTER — Emergency Department (HOSPITAL_COMMUNITY)
Admission: EM | Admit: 2019-10-23 | Discharge: 2019-10-23 | Disposition: A | Discharge status: Home / Self Care | Payer: BC Managed Care – PPO | Attending: Emergency Medicine | Admitting: Emergency Medicine

## 2019-10-23 ENCOUNTER — Emergency Department (HOSPITAL_COMMUNITY): Payer: BC Managed Care – PPO

## 2019-10-23 ENCOUNTER — Encounter (HOSPITAL_COMMUNITY): Payer: Self-pay | Admitting: Emergency Medicine

## 2019-10-23 DIAGNOSIS — S6710XA Crushing injury of unspecified finger(s), initial encounter: Secondary | ICD-10-CM
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
## 2019-10-23 NOTE — ED Provider Notes (Signed)
[facility]-EMERGENCY DEPT Provider Note   CSN: [id number] Arrival date & time: 10/22/19  2336     History Chief Complaint  Patient presents with  . Finger Injury    L ring finger    Karen Yang is a 24 y.o. female.  24 year old female presents with injury to her left middle finger.  This occurred 2 days ago when it was smashed in a car door.  Denies any distal numbness or tingling.  Pain characterizes sharp and worse with any movement.  No treatment use prior to arrival        Past Medical History:  Diagnosis Date  . Anemia   . Blood transfusion without reported diagnosis    as an infant  . Dysmenorrhea     Patient Active Problem List   Diagnosis Date Noted  . Pelvic pain 06/08/2017  . Dysmenorrhea   . Anemia     Past Surgical History:  Procedure Laterality Date  . wisdom teeth removal       OB History    Gravida  0   Para  0   Term  0   Preterm  0   AB  0   Living  0     SAB  0   TAB  0   Ectopic  0   Multiple  0   Live Births  0           Family History  Problem Relation Age of Onset  . Thyroid disease Mother   . Ovarian cysts Sister        half sister, aged 55 when cyst was removed    Social History   Tobacco Use  . Smoking status: Never Smoker  . Smokeless tobacco: Never Used  Vaping Use  . Vaping Use: Never used  Substance Use Topics  . Alcohol use: No  . Drug use: No    Home Medications Prior to Admission medications   Medication Sig Start Date End Date Taking? Authorizing Provider  ALBUTEROL IN Inhale into the lungs.    [provider]  ibuprofen (ADVIL) 800 MG tablet Take 1 tablet (800 mg total) by mouth 3 (three) times daily. 03/07/19   Roxy Horseman, PA-C  levonorgestrel-ethinyl estradiol (ALESSE) 0.1-20 MG-MCG tablet Take 1 tablet by mouth daily. 10/16/19   Jerene Bears, MD    Allergies    Patient has no known allergies.  Review of Systems   Review of Systems  All  other systems reviewed and are negative.   Physical Exam Updated Vital Signs BP (!) 134/102 (BP Location: Left Arm)   Pulse 84   Temp 98 F (36.7 C) (Oral)   Resp 16   Ht 1.6 m (5\' 3" )   Wt 51.3 kg   LMP 09/22/2019   SpO2 100%   BMI 20.02 kg/m   Physical Exam Vitals and nursing note reviewed.  Constitutional:      Appearance: She is well-developed. She is not toxic-appearing.  HENT:     Head: Normocephalic and atraumatic.  Eyes:     Conjunctiva/sclera: Conjunctivae normal.     Pupils: Pupils are equal, round, and reactive to light.  Cardiovascular:     Rate and Rhythm: Normal rate.  Pulmonary:     Effort: Pulmonary effort is normal.  Musculoskeletal:     Cervical back: Normal range of motion.     Comments: Tenderness to palpation at distal tip of left middle finger.  No deformity noted.  Faint ecchymosis.  Skin:    General: Skin is warm and dry.  Neurological:     Mental Status: She is alert and oriented to person, place, and time.     ED Results / Procedures / Treatments   Labs (all labs ordered are listed, but only abnormal results are displayed) Labs Reviewed - No data to display  EKG None  Radiology DG Finger Ring Left  Result Date: 10/23/2019 CLINICAL DATA:  Crush injury to the left ring finger EXAM: LEFT RING FINGER 2+V COMPARISON:  None. FINDINGS: There is no evidence of fracture or dislocation. There is no evidence of arthropathy or other focal bone abnormality. Soft tissues are unremarkable. IMPRESSION: Negative. Electronically Signed   By: Burman Nieves M.D.   On: 10/23/2019 00:46    Procedures Procedures (including critical care time)  Medications Ordered in ED Medications - No data to display  ED Course  I have reviewed the triage vital signs and the nursing notes.  Pertinent labs & imaging results that were available during my care of the patient were reviewed by me and considered in my medical decision making (see chart for details).     MDM Rules/Calculators/A&P                          X-ray is negative.  Return precautions given Final Clinical Impression(s) / ED Diagnoses Final diagnoses:  None    Rx / DC Orders ED Discharge Orders    None       Lorre Nick, MD 10/23/19 [phone]

## 2019-11-10 ENCOUNTER — Emergency Department (HOSPITAL_COMMUNITY)
Admission: EM | Admit: 2019-11-10 | Discharge: 2019-11-10 | Disposition: A | Discharge status: Home / Self Care | Payer: BC Managed Care – PPO | Attending: Emergency Medicine | Admitting: Emergency Medicine

## 2019-11-10 ENCOUNTER — Other Ambulatory Visit: Payer: Self-pay

## 2019-11-10 ENCOUNTER — Encounter (HOSPITAL_COMMUNITY): Payer: Self-pay | Admitting: Emergency Medicine

## 2019-11-10 DIAGNOSIS — W228XXA Striking against or struck by other objects, initial encounter: Secondary | ICD-10-CM | POA: Diagnosis not present

## 2019-11-10 DIAGNOSIS — Y92009 Unspecified place in unspecified non-institutional (private) residence as the place of occurrence of the external cause: Secondary | ICD-10-CM | POA: Insufficient documentation

## 2019-11-10 DIAGNOSIS — Y9301 Activity, walking, marching and hiking: Secondary | ICD-10-CM | POA: Insufficient documentation

## 2019-11-10 DIAGNOSIS — S0990XA Unspecified injury of head, initial encounter: Secondary | ICD-10-CM | POA: Diagnosis present

## 2019-11-10 LAB — POC URINE PREG, ED: Preg Test, Ur: NEGATIVE

## 2019-11-10 MED ORDER — OXYCODONE-ACETAMINOPHEN 5-325 MG PO TABS
1.0000 | ORAL_TABLET | Freq: Once | ORAL | Status: AC
  Administered 2019-11-10: 1 via ORAL
  Filled 2019-11-10: qty 1

## 2019-11-10 NOTE — ED Triage Notes (Signed)
Pt st's she ran into a wall today at home hitting the left side of her head.  Denies LOC, c/o feeling pressure type pain behind eyes.  Pt alert and oriented x's 3

## 2019-11-10 NOTE — ED Provider Notes (Signed)
[facility] EMERGENCY DEPARTMENT Provider Note   CSN: [id number] Arrival date & time: 11/10/19  1525     History Chief Complaint  Patient presents with  . Head Injury    Karen Yang is a 24 y.o. female.  HPI     24 year old female presents today complaining of striking her head on the wall.  States she was walking through her house when she struck her head on the wall because she was not looking up.  She denies any assault.  She did not lose consciousness.  She states she has a headache behind her eyes described as pressure since that time.  She denies any other injury. Past Medical History:  Diagnosis Date  . Anemia   . Blood transfusion without reported diagnosis    as an infant  . Dysmenorrhea     Patient Active Problem List   Diagnosis Date Noted  . Pelvic pain 06/08/2017  . Dysmenorrhea   . Anemia     Past Surgical History:  Procedure Laterality Date  . wisdom teeth removal       OB History    Gravida  0   Para  0   Term  0   Preterm  0   AB  0   Living  0     SAB  0   TAB  0   Ectopic  0   Multiple  0   Live Births  0           Family History  Problem Relation Age of Onset  . Thyroid disease Mother   . Ovarian cysts Sister        half sister, aged 23 when cyst was removed    Social History   Tobacco Use  . Smoking status: Never Smoker  . Smokeless tobacco: Never Used  Vaping Use  . Vaping Use: Never used  Substance Use Topics  . Alcohol use: No  . Drug use: No    Home Medications Prior to Admission medications   Medication Sig Start Date End Date Taking? Authorizing Provider  ALBUTEROL IN Inhale into the lungs.    [provider]  ibuprofen (ADVIL) 800 MG tablet Take 1 tablet (800 mg total) by mouth 3 (three) times daily. 03/07/19   Roxy Horseman, PA-C  levonorgestrel-ethinyl estradiol (ALESSE) 0.1-20 MG-MCG tablet Take 1 tablet by mouth daily. 10/16/19   Jerene Bears, MD     Allergies    Patient has no known allergies.  Review of Systems   Review of Systems  All other systems reviewed and are negative.   Physical Exam Updated Vital Signs BP 122/68 (BP Location: Right Arm)   Pulse 77   Temp 98.6 F (37 C) (Oral)   Resp 18   Ht 1.6 m (5\' 3" )   Wt 51.3 kg   LMP 10/26/2019 (Exact Date)   SpO2 100%   BMI 20.02 kg/m   Physical Exam Vitals reviewed.  Constitutional:      Appearance: Normal appearance.  HENT:     Head: Normocephalic.     Right Ear: External ear normal.     Left Ear: External ear normal.     Nose: Nose normal.     Mouth/Throat:     Mouth: Mucous membranes are moist.  Eyes:     Extraocular Movements: Extraocular movements intact.     Pupils: Pupils are equal, round, and reactive to light.  Cardiovascular:     Rate and Rhythm: Normal rate  and regular rhythm.     Pulses: Normal pulses.  Pulmonary:     Effort: Pulmonary effort is normal.     Breath sounds: Normal breath sounds.  Abdominal:     General: Abdomen is flat.  Musculoskeletal:        General: Normal range of motion.     Cervical back: Normal range of motion.  Skin:    General: Skin is warm and dry.     Capillary Refill: Capillary refill takes less than 2 seconds.  Neurological:     General: No focal deficit present.     Mental Status: She is alert.     Cranial Nerves: No cranial nerve deficit.     Sensory: No sensory deficit.     Motor: No weakness.     Coordination: Coordination normal.     Gait: Gait normal.     Deep Tendon Reflexes: Reflexes normal.  Psychiatric:        Mood and Affect: Mood normal.     ED Results / Procedures / Treatments   Labs (all labs ordered are listed, but only abnormal results are displayed) Labs Reviewed  POC URINE PREG, ED    EKG None  Radiology No results found.  Procedures Procedures (including critical care time)  Medications Ordered in ED Medications  oxyCODONE-acetaminophen (PERCOCET/ROXICET) 5-325 MG  per tablet 1 tablet (1 tablet Oral Given 11/10/19 2029)    ED Course  I have reviewed the triage vital signs and the nursing notes.  Pertinent labs & imaging results that were available during my care of the patient were reviewed by me and considered in my medical decision making (see chart for details).    MDM Rules/Calculators/A&P                          24 year old female presents today complaining of headache after striking her head on a wall.  She reports some history of concussions in the past.  She has a headache but no focal neurological deficits, no loss of consciousness, and no external signs of trauma.  CT scan not indicated at this time.  She is given 1 Percocet here.  She is given a work note and instructed regarding return precautions and need for follow-up and voices understanding. Final Clinical Impression(s) / ED Diagnoses Final diagnoses:  Minor head injury, initial encounter    Rx / DC Orders ED Discharge Orders    None       Margarita Grizzle, MD 11/10/19 2237

## 2020-04-06 ENCOUNTER — Other Ambulatory Visit: Payer: Self-pay

## 2020-04-06 ENCOUNTER — Emergency Department (HOSPITAL_COMMUNITY): Payer: Medicaid Other

## 2020-04-06 ENCOUNTER — Emergency Department (HOSPITAL_COMMUNITY)
Admission: EM | Admit: 2020-04-06 | Discharge: 2020-04-06 | Disposition: A | Discharge status: Home / Self Care | Payer: Medicaid Other | Attending: Emergency Medicine | Admitting: Emergency Medicine

## 2020-04-06 DIAGNOSIS — W01198A Fall on same level from slipping, tripping and stumbling with subsequent striking against other object, initial encounter: Secondary | ICD-10-CM | POA: Insufficient documentation

## 2020-04-06 DIAGNOSIS — S20212A Contusion of left front wall of thorax, initial encounter: Secondary | ICD-10-CM

## 2020-04-06 DIAGNOSIS — S20312A Abrasion of left front wall of thorax, initial encounter: Secondary | ICD-10-CM | POA: Insufficient documentation

## 2020-04-06 MED ORDER — IBUPROFEN 400 MG PO TABS
400.0000 mg | ORAL_TABLET | Freq: Once | ORAL | Status: AC | PRN
  Administered 2020-04-06: 400 mg via ORAL
  Filled 2020-04-06: qty 1

## 2020-04-06 MED ORDER — NAPROXEN 375 MG PO TABS: 375.0000 mg | ORAL_TABLET | Freq: Two times a day (BID) | ORAL | 0 refills | Status: DC

## 2020-04-06 NOTE — ED Provider Notes (Signed)
[facility] EMERGENCY DEPARTMENT Provider Note   CSN: [id number] Arrival date & time: 04/06/20  2146     History Chief Complaint  Patient presents with  . Rib Injury    Karen Yang is a 25 y.o. female.  Karen Yang presents with c/o Rib injury. 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 This happened about 3 hours ago. She c/o pain with touch and very deep breaths. She denies hemoptysis or other inury.  The history is provided by the patient.       Past Medical History:  Diagnosis Date  . Anemia   . Blood transfusion without reported diagnosis    as an infant  . Dysmenorrhea     Patient Active Problem List   Diagnosis Date Noted  . Pelvic pain 06/08/2017  . Dysmenorrhea   . Anemia     Past Surgical History:  Procedure Laterality Date  . wisdom teeth removal       OB History    Gravida  0   Para  0   Term  0   Preterm  0   AB  0   Living  0     SAB  0   IAB  0   Ectopic  0   Multiple  0   Live Births  0           Family History  Problem Relation Age of Onset  . Thyroid disease Mother   . Ovarian cysts Sister        half sister, aged 71 when cyst was removed    Social History   Tobacco Use  . Smoking status: Never Smoker  . Smokeless tobacco: Never Used  Vaping Use  . Vaping Use: Never used  Substance Use Topics  . Alcohol use: No  . Drug use: No    Home Medications Prior to Admission medications   Medication Sig Start Date End Date Taking? Authorizing Provider  ALBUTEROL IN Inhale into the lungs.    [provider]  ibuprofen (ADVIL) 800 MG tablet Take 1 tablet (800 mg total) by mouth 3 (three) times daily. 03/07/19   Roxy Horseman, PA-C  levonorgestrel-ethinyl estradiol (ALESSE) 0.1-20 MG-MCG tablet Take 1 tablet by mouth daily. 10/16/19   Jerene Bears, MD    Allergies    Patient has no known allergies.  Review of Systems    Review of Systems  Respiratory: Negative for shortness of breath.   Neurological: Negative for weakness.    Physical Exam Updated Vital Signs BP 126/79 (BP Location: Left Arm)   Pulse 87   Temp (!) 97.5 F (36.4 C) (Oral)   Resp 16   SpO2 100%   Physical Exam Vitals and nursing note reviewed.  Constitutional:      General: She is not in acute distress.    Appearance: She is well-developed. She is not diaphoretic.  HENT:     Head: Normocephalic and atraumatic.  Eyes:     General: No scleral icterus.    Conjunctiva/sclera: Conjunctivae normal.  Cardiovascular:     Rate and Rhythm: Normal rate and regular rhythm.     Heart sounds: Normal heart sounds. No murmur heard. No friction rub. No gallop.   Pulmonary:     Effort: Pulmonary effort is normal. No respiratory distress.     Breath sounds: Normal breath sounds.  Chest:     Chest wall: Tenderness  present. No deformity or swelling.       Comments: Tenderness to the L lateral chest wall. Small abrasion on the inferior rib margin. No bruising, crepitus or step off. Normal air movement and breath sounds Abdominal:     General: Bowel sounds are normal. There is no distension.     Palpations: Abdomen is soft. There is no mass.     Tenderness: There is no abdominal tenderness. There is no guarding.  Musculoskeletal:     Cervical back: Normal range of motion.  Skin:    General: Skin is warm and dry.  Neurological:     Mental Status: She is alert and oriented to person, place, and time.  Psychiatric:        Behavior: Behavior normal.     ED Results / Procedures / Treatments   Labs (all labs ordered are listed, but only abnormal results are displayed) Labs Reviewed - No data to display  EKG None  Radiology DG Ribs Unilateral W/Chest Left  Result Date: 04/06/2020 CLINICAL DATA:  Status post fall with subsequent left-sided rib pain. EXAM: LEFT RIBS AND CHEST - 3+ VIEW COMPARISON:  November 14, 2017 FINDINGS: No  fracture or other bone lesions are seen involving the ribs. There is no evidence of pneumothorax or pleural effusion. Both lungs are clear. Heart size and mediastinal contours are within normal limits. IMPRESSION: Negative. Electronically Signed   By: Aram Candela M.D.   On: 04/06/2020 22:41    Procedures Procedures  Medications Ordered in ED Medications  ibuprofen (ADVIL) tablet 400 mg (400 mg Oral Given 04/06/20 2214)    ED Course  I have reviewed the triage vital signs and the nursing notes.  Pertinent labs & imaging results that were available during my care of the patient were reviewed by me and considered in my medical decision making (see chart for details).    MDM Rules/Calculators/A&P                          .Patient here wit rib injury. I ordered and reviewed images of  A left rib view and chest XR which shows no acute abnormality. Will dc with antiinflammatories. Discussed need for deep breathing may apply ice packs and lidocaine. Work not provided. Final Clinical Impression(s) / ED Diagnoses Final diagnoses:  None    Rx / DC Orders ED Discharge Orders    None       Arthor Captain, PA-C 04/06/20 2319    Derwood Kaplan, MD 04/07/20 1131

## 2020-04-06 NOTE — ED Notes (Signed)
Patient verbalizes understanding of discharge instructions. Prescriptions reviewed. Opportunity for questioning and answers were provided. Armband removed by staff, pt discharged from ED ambulatory. ° °

## 2020-04-06 NOTE — Discharge Instructions (Signed)
Get help right away if you: Have difficulty breathing or shortness of breath. Develop a continual cough, or you cough up thick or bloody mucus from your lungs (sputum). Feel nauseous or you vomit. Have pain in your abdomen. 

## 2020-04-06 NOTE — ED Triage Notes (Signed)
Pt presents to ED POv. Pt c/o L rib pain. Pt reports that she slipped and fell onto chair at work and hit the L ide of her ribs. Denies head injury.

## 2020-04-21 ENCOUNTER — Emergency Department (HOSPITAL_COMMUNITY): Payer: 59

## 2020-04-21 ENCOUNTER — Emergency Department (HOSPITAL_COMMUNITY)
Admission: EM | Admit: 2020-04-21 | Discharge: 2020-04-21 | Disposition: A | Discharge status: Home / Self Care | Payer: 59 | Attending: Emergency Medicine | Admitting: Emergency Medicine

## 2020-04-21 ENCOUNTER — Encounter (HOSPITAL_COMMUNITY): Payer: Self-pay | Admitting: Emergency Medicine

## 2020-04-21 DIAGNOSIS — R102 Pelvic and perineal pain: Secondary | ICD-10-CM

## 2020-04-21 DIAGNOSIS — R0602 Shortness of breath: Secondary | ICD-10-CM | POA: Diagnosis not present

## 2020-04-21 DIAGNOSIS — R300 Dysuria: Secondary | ICD-10-CM

## 2020-04-21 LAB — URINALYSIS, ROUTINE W REFLEX MICROSCOPIC
Bilirubin Urine: NEGATIVE
Glucose, UA: NEGATIVE mg/dL
Hgb urine dipstick: NEGATIVE
Ketones, ur: NEGATIVE mg/dL
Leukocytes,Ua: NEGATIVE
Nitrite: POSITIVE — AB
Protein, ur: NEGATIVE mg/dL
Specific Gravity, Urine: 1.016 (ref 1.005–1.030)
pH: 6 (ref 5.0–8.0)

## 2020-04-21 LAB — COMPREHENSIVE METABOLIC PANEL
ALT: 15 U/L (ref 0–44)
AST: 16 U/L (ref 15–41)
Albumin: 4.1 g/dL (ref 3.5–5.0)
Alkaline Phosphatase: 65 U/L (ref 38–126)
Anion gap: 5 (ref 5–15)
BUN: 6 mg/dL (ref 6–20)
CO2: 23 mmol/L (ref 22–32)
Calcium: 8.9 mg/dL (ref 8.9–10.3)
Chloride: 109 mmol/L (ref 98–111)
Creatinine, Ser: 0.6 mg/dL (ref 0.44–1.00)
GFR, Estimated: >60 mL/min (ref >60)
Glucose, Bld: 95 mg/dL (ref 70–99)
Potassium: 3.7 mmol/L (ref 3.5–5.1)
Sodium: 137 mmol/L (ref 135–145)
Total Bilirubin: 0.5 mg/dL (ref 0.3–1.2)
Total Protein: 7.2 g/dL (ref 6.5–8.1)

## 2020-04-21 LAB — CBC
HCT: 34.9 % — ABNORMAL LOW (ref 36.0–46.0)
Hemoglobin: 10.8 g/dL — ABNORMAL LOW (ref 12.0–15.0)
MCH: 24.8 pg — ABNORMAL LOW (ref 26.0–34.0)
MCHC: 30.9 g/dL (ref 30.0–36.0)
MCV: 80.2 fL (ref 80.0–100.0)
Platelets: 283 10*3/uL (ref 150–400)
RBC: 4.35 MIL/uL (ref 3.87–5.11)
RDW: 13.2 % (ref 11.5–15.5)
WBC: 5 10*3/uL (ref 4.0–10.5)
nRBC: 0 % (ref 0.0–0.2)

## 2020-04-21 LAB — LIPASE, BLOOD: Lipase: 34 U/L (ref 11–51)

## 2020-04-21 LAB — WET PREP, GENITAL
Clue Cells Wet Prep HPF POC: NONE SEEN
Sperm: NONE SEEN
Trich, Wet Prep: NONE SEEN
Yeast Wet Prep HPF POC: NONE SEEN

## 2020-04-21 LAB — I-STAT BETA HCG BLOOD, ED (MC, WL, AP ONLY): I-stat hCG, quantitative: <5 m[IU]/mL (ref <5)

## 2020-04-21 MED ORDER — CEPHALEXIN 500 MG PO CAPS: 500.0000 mg | ORAL_CAPSULE | Freq: Two times a day (BID) | ORAL | 0 refills | Status: AC

## 2020-04-21 MED ORDER — CEPHALEXIN 250 MG PO CAPS
500.0000 mg | ORAL_CAPSULE | Freq: Once | ORAL | Status: AC
  Administered 2020-04-21: 500 mg via ORAL
  Filled 2020-04-21: qty 2

## 2020-04-21 NOTE — Discharge Instructions (Signed)
US shows small right ovarian cyst.  Urine sample shows possible urinary tract infection.  Prescription sent to pharmacy for keflex.  This is antibiotic urinary tract infection.  Take as prescribed.   Follow-up with primary care doctor or GYN clinic as needed.

## 2020-04-21 NOTE — ED Provider Notes (Signed)
[facility] EMERGENCY DEPARTMENT Provider Note   CSN: [id number] Arrival date & time: 04/21/20  1606     History Chief Complaint  Patient presents with  . Pelvic Pain    Karen Yang is a 25 y.o. female past medical history significant for anemia, dysmenorrhea, pelvic pain.  HPI Patient presents to emergency department today with chief complaint of intermittent pelvic pain x4 days.  States the pain is located across her pelvis.  She describes it as aching.  The pain will occasionally shoot into her groin.  She rates the pain 4 out of 10 in severity.  She endorses associated vaginal discharge.  States it has been in and white.  Unsure if there is any associated pruritus.  Denies any history of yeast infections, states she has had bacterial vaginosis in the past.  She states this morning she had dysuria and urinary frequency.  She took an Azo pill.  She denies any history of recent UTI.  She admits to being sexually active with 1 female partner her husband and they do not use protection.  She is not concerned for STIs.  LMP 04/01/2016.  She denies any fever, chills, abdominal pain, nausea, emesis, gross hematuria, abnormal vaginal bleeding, dyspareunia.  Patient also states she felt short of breath at church today.  She was standing up and felt like it was hard to breathe.  She denies any associated chest pain, palpitations, syncope, lightheaded or dizziness.  She states symptoms lasted less than 10 minutes and resolved spontaneously.  No medications taken prior to arrival.  She states she ate [employer] for lunch around noon and went to church around 2 PM so she does not think that she had low blood sugar.  She was feeling anxious about going to work and has had increased stress because of work lately. She denies any family history of cardiac disease.  She denies any history of clotting disorder herself or others.  She has no PE risk factors.  Not on exogenous estrogen.  Past  Medical History:  Diagnosis Date  . Anemia   . Blood transfusion without reported diagnosis    as an infant  . Dysmenorrhea     Patient Active Problem List   Diagnosis Date Noted  . Pelvic pain 06/08/2017  . Dysmenorrhea   . Anemia     Past Surgical History:  Procedure Laterality Date  . wisdom teeth removal       OB History    Gravida  0   Para  0   Term  0   Preterm  0   AB  0   Living  0     SAB  0   IAB  0   Ectopic  0   Multiple  0   Live Births  0           Family History  Problem Relation Age of Onset  . Thyroid disease Mother   . Ovarian cysts Sister        half sister, aged 74 when cyst was removed    Social History   Tobacco Use  . Smoking status: Never Smoker  . Smokeless tobacco: Never Used  Vaping Use  . Vaping Use: Never used  Substance Use Topics  . Alcohol use: No  . Drug use: No    Home Medications Prior to Admission medications   Medication Sig Start Date End Date Taking? Authorizing Provider  cephALEXin (KEFLEX) 500 MG capsule Take 1 capsule (500  mg total) by mouth 2 (two) times daily for 5 days. 04/22/20 04/27/20 Yes Walisiewicz, Kaitlyn E, PA-C  ALBUTEROL IN Inhale into the lungs.    [provider]  ibuprofen (ADVIL) 800 MG tablet Take 1 tablet (800 mg total) by mouth 3 (three) times daily. 03/07/19   Roxy Horseman, PA-C  levonorgestrel-ethinyl estradiol (ALESSE) 0.1-20 MG-MCG tablet Take 1 tablet by mouth daily. 10/16/19   Jerene Bears, MD  naproxen (NAPROSYN) 375 MG tablet Take 1 tablet (375 mg total) by mouth 2 (two) times daily. 04/06/20   Arthor Captain, PA-C    Allergies    Patient has no known allergies.  Review of Systems   Review of Systems All other systems are reviewed and are negative for acute change except as noted in the HPI.  Physical Exam Updated Vital Signs BP 124/74   Pulse 76   Temp 98.2 F (36.8 C) (Oral)   Resp 15   LMP 04/01/2020   SpO2 100%   Physical Exam Vitals and  nursing note reviewed.  Constitutional:      General: She is not in acute distress.    Appearance: She is not ill-appearing.  HENT:     Head: Normocephalic and atraumatic.     Right Ear: Tympanic membrane and external ear normal.     Left Ear: Tympanic membrane and external ear normal.     Nose: Nose normal.     Mouth/Throat:     Mouth: Mucous membranes are moist.     Pharynx: Oropharynx is clear.  Eyes:     General: No scleral icterus.       Right eye: No discharge.        Left eye: No discharge.     Extraocular Movements: Extraocular movements intact.     Conjunctiva/sclera: Conjunctivae normal.     Pupils: Pupils are equal, round, and reactive to light.  Neck:     Vascular: No JVD.  Cardiovascular:     Rate and Rhythm: Normal rate and regular rhythm.     Pulses: Normal pulses.          Radial pulses are 2+ on the right side and 2+ on the left side.     Heart sounds: Normal heart sounds.  Pulmonary:     Comments: Lungs clear to auscultation in all fields. Symmetric chest rise. No wheezing, rales, or rhonchi. Chest:     Chest wall: No tenderness.  Abdominal:     Tenderness: There is no right CVA tenderness or left CVA tenderness.     Comments: Abdomen is soft, non-distended. No abdominal tenderness. Tender to palpation across pelvis .No rigidity, no guarding. No peritoneal signs.  Genitourinary:    Comments: Normal external genitalia. No pain with speculum insertion. Closed cervical os with normal appearance - no rash or lesions. No significant discharge or bleeding noted from cervix or in vaginal vault. On bimanual examination no adnexal tenderness or cervical motion tenderness. Chaperone [place] RN present during exam.  Musculoskeletal:        General: Normal range of motion.     Cervical back: Normal range of motion.  Skin:    General: Skin is warm and dry.     Capillary Refill: Capillary refill takes less than 2 seconds.  Neurological:     Mental Status: She is  oriented to person, place, and time.     GCS: GCS eye subscore is 4. GCS verbal subscore is 5. GCS motor subscore is 6.  Comments: Fluent speech, no facial droop.  Psychiatric:        Behavior: Behavior normal.     ED Results / Procedures / Treatments   Labs (all labs ordered are listed, but only abnormal results are displayed) Labs Reviewed  WET PREP, GENITAL - Abnormal; Notable for the following components:      Result Value   WBC, Wet Prep HPF POC MANY (*)    All other components within normal limits  CBC - Abnormal; Notable for the following components:   Hemoglobin 10.8 (*)    HCT 34.9 (*)    MCH 24.8 (*)    All other components within normal limits  URINALYSIS, ROUTINE W REFLEX MICROSCOPIC - Abnormal; Notable for the following components:   Color, Urine AMBER (*)    Nitrite POSITIVE (*)    Bacteria, UA RARE (*)    All other components within normal limits  URINE CULTURE  LIPASE, BLOOD  COMPREHENSIVE METABOLIC PANEL  I-STAT BETA HCG BLOOD, ED ([facility], [facility], [facility] ONLY)  GC/CHLAMYDIA PROBE AMP ([place]) NOT AT [facility]    EKG EKG Interpretation  Date/Time:  Sunday April 21 2020 16:13:50 EDT Ventricular Rate:  78 PR Interval:  112 QRS Duration: 98 QT Interval:  362 QTC Calculation: 412 R Axis:   95 Text Interpretation: Normal sinus rhythm Rightward axis Nonspecific ST abnormality Abnormal ECG since last tracing no significant change Confirmed by Mancel BaleWentz, Elliott [phone]([id number]) on 04/21/2020 8:17:23 PM   Radiology DG Chest 2 View  Result Date: 04/21/2020 CLINICAL DATA:  Acute shortness of breath and chest pain today. EXAM: CHEST - 2 VIEW COMPARISON:  04/06/2020 FINDINGS: The cardiomediastinal silhouette is unremarkable. There is no evidence of focal airspace disease, pulmonary edema, suspicious pulmonary nodule/mass, pleural effusion, or pneumothorax. No acute bony abnormalities are identified. IMPRESSION: No active cardiopulmonary disease. Electronically Signed   By: Harmon PierJeffrey  Hu  M.D.   On: 04/21/2020 16:46   US PELVIC COMPLETE W TRANSVAGINAL AND TORSION R/O  Result Date: 04/21/2020 CLINICAL DATA:  Pelvic pain for 4 days. EXAM: TRANSABDOMINAL AND TRANSVAGINAL ULTRASOUND OF PELVIS DOPPLER ULTRASOUND OF OVARIES TECHNIQUE: Both transabdominal and transvaginal ultrasound examinations of the pelvis were performed. Transabdominal technique was performed for global imaging of the pelvis including uterus, ovaries, adnexal regions, and pelvic cul-de-sac. It was necessary to proceed with endovaginal exam following the transabdominal exam to visualize the uterus, endometrium, and ovaries. Color and duplex Doppler ultrasound was utilized to evaluate blood flow to the ovaries. COMPARISON:  Pelvic ultrasound 06/24/2017 reviewed, CT 05/14/2018 FINDINGS: Uterus Measurements: 9.1 x 3.0 x 4.9 cm = volume: 67 mL. The uterus is anteverted. Bicornuate configuration. No fibroids or other mass visualized. Endometrium Thickness: 15 mm in the right uterine horn, 10 mm in the left uterine horn. No focal abnormality visualized. Right ovary Measurements: 3.7 x 2.5 x 2.5 cm = volume: 12 mL. Small complex 1.8 cm cyst with peripheral but no internal vascularity may represent a hemorrhagic or corpus luteal cyst. Normal blood flow to the ovarian parenchyma. No adnexal mass. Left ovary Measurements: 2.3 x 1.5 x 1.3 cm = volume: 2.3 mL. Physiologic follicles. No cyst or adnexal mass. Pulsed Doppler evaluation of both ovaries demonstrates normal low-resistance arterial and venous waveforms. Other findings No abnormal free fluid. IMPRESSION: 1. Complex cyst in the right ovary measuring 1.8 cm may represent a hemorrhagic or evolving corpus luteal cyst. No further follow-up is needed given size. There is normal ovarian blood flow. 2. Normal sonographic appearance of the left ovary.  3. Bicornuate uterus. Electronically Signed   By: Narda Rutherford M.D.   On: 04/21/2020 21:34    Procedures Procedures   Medications  Ordered in ED Medications  cephALEXin (KEFLEX) capsule 500 mg (500 mg Oral Given 04/21/20 2237)    ED Course  I have reviewed the triage vital signs and the nursing notes.  Pertinent labs & imaging results that were available during my care of the patient were reviewed by me and considered in my medical decision making (see chart for details).    MDM Rules/Calculators/A&P                          History provided by patient with additional history obtained from chart review.    Presenting with pelvic pain and SOB. On ED arrival she is afebrile, hemodynamically stable.  She is well-appearing in no acute distress.  On exam lungs are clear to auscultation all fields and she has normal work of breathing.  She has no hypoxia or tachycardia.  She has tenderness to palpation across pelvis.  No peritoneal signs.  Her shortness of breath sounds like it was related to anxiety.  Not suggestive of ACS or PE.  No concerning symptoms to support either.  Patient denies need for analgesics or antiemetics.  Labs were collected in triage.  CBC without leukocytosis, hemoglobin 10.8.  She has a history of anemia and chart review shows most recent labs were 1 year ago when her hemoglobin was in the 15's.  She does not need emergent transfusion, likely related to finishing menses cycle recently.  CMP unremarkable.  Lipase within normal range.  Pregnancy test is negative.  EKG without obvious ischemia. I viewed pt's chest xray and it does not suggest acute infectious processes.  UA nitrite positive.  Urine culture sent.  Exam performed with chaperone present.  No cervical motion or adnexal tenderness.  Unlikely PID.  Wet prep only shows WBCs, no clue cells or yeast, no treatment needed. Pelvic ultrasound shows small 1.8 cm right ovarian cyst.  Based on the size follow-up is not needed. Reassessed patient and discussed results.  As she is symptomatic we will treat UTI with Keflex.  Serial abdominal exams have been benign,  no indications for acute surgical abdomen.  Recommend she take ibuprofen for pain at home as needed.  The patient appears reasonably screened and/or stabilized for discharge and I doubt any other medical condition or other [facility] requiring further screening, evaluation, or treatment in the ED at this time prior to discharge. The patient is safe for discharge with strict return precautions discussed. Recommend pcp follow up.     Portions of this note were generated with Scientist, clinical (histocompatibility and immunogenetics). Dictation errors may occur despite best attempts at proofreading.    Portions of this note were generated with Scientist, clinical (histocompatibility and immunogenetics). Dictation errors may occur despite best attempts at proofreading.      Portions of this note were generated with Scientist, clinical (histocompatibility and immunogenetics). Dictation errors may occur despite best attempts at proofreading.   Final Clinical Impression(s) / ED Diagnoses Final diagnoses:  Pelvic pain  Dysuria    Rx / DC Orders ED Discharge Orders         Ordered    cephALEXin (KEFLEX) 500 MG capsule  2 times daily        04/21/20 2229           Kandice Hams 04/21/20 2239    Mancel Bale, MD  04/24/20 0710  

## 2020-04-21 NOTE — ED Triage Notes (Signed)
C/o generalized abd pain x 4 days with pelvic pain today that radiates to vaginal area.  Also reports nausea x 3 days. Felt SOB while at church today.  Pt fell on a chair 3 weeks ago at work and states she continues to have some L sided rib pain.  Denies vomiting, diarrhea, and urinary complaints.

## 2020-04-22 LAB — GC/CHLAMYDIA PROBE AMP (~~LOC~~) NOT AT ARMC
Chlamydia: NEGATIVE
Comment: NEGATIVE
Comment: NORMAL
Neisseria Gonorrhea: NEGATIVE

## 2020-04-23 LAB — URINE CULTURE: Culture: <10000 — AB

## 2020-07-12 ENCOUNTER — Ambulatory Visit (HOSPITAL_COMMUNITY)
Admission: EM | Admit: 2020-07-12 | Discharge: 2020-07-12 | Disposition: A | Discharge status: Home / Self Care | Payer: 59 | Attending: Internal Medicine | Admitting: Internal Medicine

## 2020-07-12 ENCOUNTER — Other Ambulatory Visit: Payer: Self-pay

## 2020-07-12 ENCOUNTER — Encounter (HOSPITAL_COMMUNITY): Payer: Self-pay | Admitting: Emergency Medicine

## 2020-07-12 DIAGNOSIS — N3001 Acute cystitis with hematuria: Secondary | ICD-10-CM

## 2020-07-12 DIAGNOSIS — N939 Abnormal uterine and vaginal bleeding, unspecified: Secondary | ICD-10-CM | POA: Insufficient documentation

## 2020-07-12 LAB — POC URINE PREG, ED: Preg Test, Ur: NEGATIVE

## 2020-07-12 LAB — POCT URINALYSIS DIPSTICK, ED / UC
Bilirubin Urine: NEGATIVE
Glucose, UA: NEGATIVE mg/dL
Ketones, ur: NEGATIVE mg/dL
Nitrite: NEGATIVE
Protein, ur: NEGATIVE mg/dL
Specific Gravity, Urine: 1.01 (ref 1.005–1.030)
Urobilinogen, UA: 0.2 mg/dL (ref 0.0–1.0)
pH: 6 (ref 5.0–8.0)

## 2020-07-12 MED ORDER — NITROFURANTOIN MONOHYD MACRO 100 MG PO CAPS: 100.0000 mg | ORAL_CAPSULE | Freq: Two times a day (BID) | ORAL | 0 refills | Status: DC

## 2020-07-12 NOTE — ED Triage Notes (Signed)
Pt presents with lower back pain, urinary frequency, dysuria and spotting that started yesterday. States took 600 mg of ibuprofen this am with relief of back pain.

## 2020-07-12 NOTE — ED Provider Notes (Signed)
[facility]    CSN: [id number] Arrival date & time: 07/12/20  1052      History   Chief Complaint Chief Complaint  Patient presents with   Back Pain   Urinary Frequency   Dysuria    HPI Karen Yang is a 25 y.o. female.   HPI  Dysuria: Pt presents with low back pain, urinary frequency, dysuria and mild vaginal spotting for the past 1 day.  She denies fevers, vomiting, pelvic pain. She has tried ibuprofen for symptoms which has helped her back pain.  Last suspected UTI was on 04/21/2020 for which she was treated with Keflex and grew out <10,000 colonies of "insignificant bacteria". Her last menstrual cycle was on May 28th.    Past Medical History:  Diagnosis Date   Anemia    Blood transfusion without reported diagnosis    as an infant   Dysmenorrhea     Patient Active Problem List   Diagnosis Date Noted   Pelvic pain 06/08/2017   Dysmenorrhea    Anemia     Past Surgical History:  Procedure Laterality Date   wisdom teeth removal      OB History     Gravida  0   Para  0   Term  0   Preterm  0   AB  0   Living  0      SAB  0   IAB  0   Ectopic  0   Multiple  0   Live Births  0            Home Medications    Prior to Admission medications   Medication Sig Start Date End Date Taking? Authorizing Provider  ALBUTEROL IN Inhale into the lungs.    [provider]  ibuprofen (ADVIL) 800 MG tablet Take 1 tablet (800 mg total) by mouth 3 (three) times daily. 03/07/19   Roxy Horseman, PA-C  levonorgestrel-ethinyl estradiol (ALESSE) 0.1-20 MG-MCG tablet Take 1 tablet by mouth daily. 10/16/19   Jerene Bears, MD  naproxen (NAPROSYN) 375 MG tablet Take 1 tablet (375 mg total) by mouth 2 (two) times daily. 04/06/20   Arthor Captain, PA-C    Family History Family History  Problem Relation Age of Onset   Thyroid disease Mother    Ovarian cysts Sister        half sister, aged 42 when cyst was removed    Social  History Social History   Tobacco Use   Smoking status: Never   Smokeless tobacco: Never  Vaping Use   Vaping Use: Never used  Substance Use Topics   Alcohol use: No   Drug use: No     Allergies   Patient has no known allergies.   Review of Systems Review of Systems  As stated above in HPI Physical Exam Triage Vital Signs ED Triage Vitals  Enc Vitals Group     BP 07/12/20 1140 118/77     Pulse Rate 07/12/20 1140 78     Resp 07/12/20 1140 16     Temp 07/12/20 1140 98.5 F (36.9 C)     Temp Source 07/12/20 1140 Oral     SpO2 07/12/20 1140 100 %     Weight --      Height --      Head Circumference --      Peak Flow --      Pain Score 07/12/20 1138 0     Pain Loc --  Pain Edu? --      Excl. in GC? --    No data found.  Updated Vital Signs BP 118/77 (BP Location: Right Arm)   Pulse 78   Temp 98.5 F (36.9 C) (Oral)   Resp 16   LMP 06/22/2020   SpO2 100%   Physical Exam Vitals and nursing note reviewed.  Constitutional:      General: She is not in acute distress.    Appearance: Normal appearance. She is not ill-appearing, toxic-appearing or diaphoretic.  Cardiovascular:     Rate and Rhythm: Normal rate and regular rhythm.     Heart sounds: Normal heart sounds.  Pulmonary:     Effort: Pulmonary effort is normal.     Breath sounds: Normal breath sounds.  Abdominal:     General: Bowel sounds are normal. There is no distension.     Palpations: Abdomen is soft. There is no mass.     Tenderness: There is no abdominal tenderness. There is no right CVA tenderness, left CVA tenderness, guarding or rebound.     Hernia: No hernia is present.  Lymphadenopathy:     Cervical: No cervical adenopathy.  Skin:    General: Skin is warm.  Neurological:     Mental Status: She is alert and oriented to person, place, and time.     UC Treatments / Results  Labs (all labs ordered are listed, but only abnormal results are displayed) Labs Reviewed  POCT URINALYSIS  DIPSTICK, ED / UC - Abnormal; Notable for the following components:      Result Value   Hgb urine dipstick MODERATE (*)    Leukocytes,Ua SMALL (*)    All other components within normal limits  POC URINE PREG, ED    EKG   Radiology No results found.  Procedures Procedures (including critical care time)  Medications Ordered in UC Medications - No data to display  Initial Impression / Assessment and Plan / UC Course  I have reviewed the triage vital signs and the nursing notes.  Pertinent labs & imaging results that were available during my care of the patient were reviewed by me and considered in my medical decision making (see chart for details).     New.  Discussed clinical case with patient.  She feels that this feels more like a UTI than previous so we will go ahead and treat her especially since she did have some increased bacteria growth on her last urine culture.  We are getting culture her urine and if again it does not show any significant growth I have highly recommended that she follow-up with urologist.  We briefly discussed potential for interstitial cystitis. Reviewed red flag signs and symptoms.  Final Clinical Impressions(s) / UC Diagnoses   Final diagnoses:  None   Discharge Instructions   None    ED Prescriptions   None    PDMP not reviewed this encounter.   Rushie Chestnut, [place] 07/12/20 1221

## 2020-07-14 LAB — URINE CULTURE: Culture: >=100000 — AB

## 2020-11-07 ENCOUNTER — Emergency Department
Admission: EM | Admit: 2020-11-07 | Discharge: 2020-11-07 | Disposition: A | Discharge status: Home / Self Care | Payer: 59 | Source: Home / Self Care

## 2020-11-07 ENCOUNTER — Other Ambulatory Visit: Payer: Self-pay

## 2020-11-07 DIAGNOSIS — L03316 Cellulitis of umbilicus: Secondary | ICD-10-CM | POA: Diagnosis not present

## 2020-11-07 MED ORDER — AMOXICILLIN-POT CLAVULANATE 875-125 MG PO TABS: 1.0000 | ORAL_TABLET | Freq: Two times a day (BID) | ORAL | 0 refills | Status: DC

## 2020-11-07 NOTE — Discharge Instructions (Addendum)
Advised patient to take medication as directed with food to completion encourage patient increase daily water intake while taking this medication. 

## 2020-11-07 NOTE — ED Triage Notes (Signed)
Pt c/o belly button piercing that may be infected. Noticed a bump that burst a week ago. Redness and inflammation getting worse.

## 2020-11-07 NOTE — ED Provider Notes (Signed)
[facility]    CSN: [id number] Arrival date & time: 11/07/20  1323      History   Chief Complaint Chief Complaint  Patient presents with   Cellulitis    Belly button piercing    HPI Karen Yang is a 25 y.o. female.   HPI 25 year old female presents with possible infection of bellybutton piercing.  Patient reports redness and inflammation that is getting worse after 7 days.  Past Medical History:  Diagnosis Date   Anemia    Blood transfusion without reported diagnosis    as an infant   Dysmenorrhea     Patient Active Problem List   Diagnosis Date Noted   Pelvic pain 06/08/2017   Dysmenorrhea    Anemia     Past Surgical History:  Procedure Laterality Date   wisdom teeth removal      OB History     Gravida  0   Para  0   Term  0   Preterm  0   AB  0   Living  0      SAB  0   IAB  0   Ectopic  0   Multiple  0   Live Births  0            Home Medications    Prior to Admission medications   Medication Sig Start Date End Date Taking? Authorizing Provider  amoxicillin-clavulanate (AUGMENTIN) 875-125 MG tablet Take 1 tablet by mouth every 12 (twelve) hours. 11/07/20  Yes Trevor Iha, FNP  ALBUTEROL IN Inhale into the lungs.    [provider]  ibuprofen (ADVIL) 800 MG tablet Take 1 tablet (800 mg total) by mouth 3 (three) times daily. 03/07/19   Roxy Horseman, PA-C  levonorgestrel-ethinyl estradiol (ALESSE) 0.1-20 MG-MCG tablet Take 1 tablet by mouth daily. 10/16/19   Jerene Bears, MD  naproxen (NAPROSYN) 375 MG tablet Take 1 tablet (375 mg total) by mouth 2 (two) times daily. 04/06/20   Arthor Captain, PA-C  nitrofurantoin, macrocrystal-monohydrate, (MACROBID) 100 MG capsule Take 1 capsule (100 mg total) by mouth 2 (two) times daily. 07/12/20   Rushie Chestnut, PA-C    Family History Family History  Problem Relation Age of Onset   Thyroid disease Mother    Ovarian cysts Sister        half sister, aged  35 when cyst was removed    Social History Social History   Tobacco Use   Smoking status: Never   Smokeless tobacco: Never  Vaping Use   Vaping Use: Never used  Substance Use Topics   Alcohol use: No   Drug use: No     Allergies   Patient has no known allergies.   Review of Systems Review of Systems  Skin:  Positive for rash.    Physical Exam Triage Vital Signs ED Triage Vitals  Enc Vitals Group     BP 11/07/20 1437 124/72     Pulse Rate 11/07/20 1437 72     Resp 11/07/20 1437 17     Temp 11/07/20 1437 98.4 F (36.9 C)     Temp Source 11/07/20 1437 Oral     SpO2 11/07/20 1437 100 %     Weight --      Height --      Head Circumference --      Peak Flow --      Pain Score 11/07/20 1414 0     Pain Loc --  Pain Edu? --      Excl. in GC? --    No data found.  Updated Vital Signs BP 124/72 (BP Location: Right Arm)   Pulse 72   Temp 98.4 F (36.9 C) (Oral)   Resp 17   LMP 10/14/2020 (Exact Date)   SpO2 100%       Physical Exam Vitals and nursing note reviewed.  Constitutional:      General: She is not in acute distress.    Appearance: Normal appearance. She is normal weight. She is not ill-appearing.  HENT:     Head: Normocephalic and atraumatic.     Mouth/Throat:     Mouth: Mucous membranes are moist.     Pharynx: Oropharynx is clear.  Eyes:     Extraocular Movements: Extraocular movements intact.     Conjunctiva/sclera: Conjunctivae normal.     Pupils: Pupils are equal, round, and reactive to light.  Cardiovascular:     Rate and Rhythm: Normal rate and regular rhythm.     Pulses: Normal pulses.     Heart sounds: Normal heart sounds.  Pulmonary:     Effort: Pulmonary effort is normal.     Breath sounds: Normal breath sounds.  Musculoskeletal:        General: Normal range of motion.     Cervical back: Normal range of motion and neck supple.  Skin:    General: Skin is warm and dry.     Comments: Umbilicus: Erythematous, indurated,  fluctuant, scant serous sanguinous drainage noted  Neurological:     General: No focal deficit present.     Mental Status: She is alert and oriented to person, place, and time. Mental status is at baseline.  Psychiatric:        Mood and Affect: Mood normal.        Behavior: Behavior normal.        Thought Content: Thought content normal.     UC Treatments / Results  Labs (all labs ordered are listed, but only abnormal results are displayed) Labs Reviewed - No data to display  EKG   Radiology No results found.  Procedures Procedures (including critical care time)  Medications Ordered in UC Medications - No data to display  Initial Impression / Assessment and Plan / UC Course  I have reviewed the triage vital signs and the nursing notes.  Pertinent labs & imaging results that were available during my care of the patient were reviewed by me and considered in my medical decision making (see chart for details).     MDM: 1.  Cellulitis of umbilicus-Rx'd Augmentin. Advised patient to take medication as directed with food to completion encourage patient increase daily water intake while taking this medication.  Patient discharged home, hemodynamically stable. Final Clinical Impressions(s) / UC Diagnoses   Final diagnoses:  Cellulitis of umbilicus     Discharge Instructions      Advised patient to take medication as directed with food to completion encourage patient increase daily water intake while taking this medication.     ED Prescriptions     Medication Sig Dispense Auth. Provider   amoxicillin-clavulanate (AUGMENTIN) 875-125 MG tablet Take 1 tablet by mouth every 12 (twelve) hours. 14 tablet Trevor Iha, FNP      PDMP not reviewed this encounter.   Trevor Iha, FNP 11/07/20 1536

## 2020-12-11 ENCOUNTER — Other Ambulatory Visit: Payer: Self-pay

## 2020-12-11 ENCOUNTER — Encounter: Payer: Self-pay | Admitting: Nurse Practitioner

## 2020-12-11 ENCOUNTER — Ambulatory Visit (INDEPENDENT_AMBULATORY_CARE_PROVIDER_SITE_OTHER): Payer: 59 | Admitting: Nurse Practitioner

## 2020-12-11 VITALS — BP 116/74

## 2020-12-11 DIAGNOSIS — N912 Amenorrhea, unspecified: Secondary | ICD-10-CM | POA: Diagnosis not present

## 2020-12-11 DIAGNOSIS — Z3A08 8 weeks gestation of pregnancy: Secondary | ICD-10-CM

## 2020-12-11 DIAGNOSIS — Z3201 Encounter for pregnancy test, result positive: Secondary | ICD-10-CM | POA: Diagnosis not present

## 2020-12-11 LAB — PREGNANCY, URINE: Preg Test, Ur: POSITIVE — AB

## 2020-12-11 NOTE — Progress Notes (Signed)
   Acute Office Visit  Subjective:    Patient ID: Karen Yang, female    DOB: 03/07/1995, 25 y.o.   MRN: [id number]   HPI 25 y.o. presents today to confirm pregnancy. Positive home UPT x 3. She thinks last menstrual period was 10/14/2020. Stopped OCPs about a year ago. Was not actively trying to conceive but is OK with pregnancy. Husband is supportive.    Review of Systems  Constitutional:  Positive for fatigue.  Genitourinary: Negative.       Objective:    Physical Exam Constitutional:      Appearance: Normal appearance.  GU: Not indicated  BP 116/74   LMP 10/14/2020  Wt Readings from Last 3 Encounters:  11/10/19 113 lb (51.3 kg)  10/23/19 113 lb (51.3 kg)  10/16/19 113 lb (51.3 kg)   UPT positive (weak)     Assessment & Plan:   Problem List Items Addressed This Visit   None Visit Diagnoses     [redacted] weeks gestation of pregnancy    -  Primary   Relevant Orders   B-HCG Quant   Amenorrhea       Relevant Orders   Pregnancy, urine (Completed)      Plan: Weak positive pregnancy test. Based on LMP she would be around 8 weeks. Will check HCG today and schedule ultrasound if >1000, otherwise we will recheck HCG in 2 days. Begin PNV. Discussed pregnancy-safe behaviors and provided her with information on local OB providers. All questions answered. She is agreeable to plan.      Olivia Mackie DNP, 4:09 PM 12/11/2020

## 2020-12-12 LAB — HCG, QUANTITATIVE, PREGNANCY: HCG, Total, QN: 807 m[IU]/mL

## 2021-03-05 ENCOUNTER — Inpatient Hospital Stay (HOSPITAL_COMMUNITY): Admission: AD | Admit: 2021-03-05 | Payer: Self-pay | Source: Home / Self Care | Admitting: Obstetrics and Gynecology

## 2021-06-28 ENCOUNTER — Inpatient Hospital Stay (HOSPITAL_COMMUNITY)
Admission: AD | Admit: 2021-06-28 | Discharge: 2021-06-30 | DRG: 833 | Disposition: A | Discharge status: Home / Self Care | Payer: Medicaid Other | Attending: Obstetrics and Gynecology | Admitting: Obstetrics and Gynecology

## 2021-06-28 ENCOUNTER — Other Ambulatory Visit: Payer: Self-pay

## 2021-06-28 ENCOUNTER — Encounter (HOSPITAL_COMMUNITY): Payer: Self-pay | Admitting: Obstetrics and Gynecology

## 2021-06-28 DIAGNOSIS — O321XX0 Maternal care for breech presentation, not applicable or unspecified: Secondary | ICD-10-CM

## 2021-06-28 DIAGNOSIS — O99013 Anemia complicating pregnancy, third trimester: Secondary | ICD-10-CM | POA: Diagnosis present

## 2021-06-28 DIAGNOSIS — O3403 Maternal care for unspecified congenital malformation of uterus, third trimester: Secondary | ICD-10-CM | POA: Diagnosis present

## 2021-06-28 DIAGNOSIS — Q513 Bicornate uterus: Secondary | ICD-10-CM

## 2021-06-28 DIAGNOSIS — O368130 Decreased fetal movements, third trimester, not applicable or unspecified: Secondary | ICD-10-CM | POA: Diagnosis present

## 2021-06-28 DIAGNOSIS — Z3A32 32 weeks gestation of pregnancy: Secondary | ICD-10-CM

## 2021-06-28 DIAGNOSIS — Z3689 Encounter for other specified antenatal screening: Secondary | ICD-10-CM

## 2021-06-28 LAB — URINALYSIS, ROUTINE W REFLEX MICROSCOPIC
Bilirubin Urine: NEGATIVE
Glucose, UA: NEGATIVE mg/dL
Hgb urine dipstick: NEGATIVE
Ketones, ur: NEGATIVE mg/dL
Nitrite: NEGATIVE
Protein, ur: NEGATIVE mg/dL
Specific Gravity, Urine: 1.005 (ref 1.005–1.030)
pH: 6 (ref 5.0–8.0)

## 2021-06-28 LAB — WET PREP, GENITAL
Clue Cells Wet Prep HPF POC: NONE SEEN
Sperm: NONE SEEN
Trich, Wet Prep: NONE SEEN
WBC, Wet Prep HPF POC: <10 (ref <10)
Yeast Wet Prep HPF POC: NONE SEEN

## 2021-06-28 MED ORDER — NIFEDIPINE 10 MG PO CAPS
10.0000 mg | ORAL_CAPSULE | ORAL | Status: AC | PRN
  Administered 2021-06-28 (×3): 10 mg via ORAL
  Filled 2021-06-28 (×3): qty 1

## 2021-06-28 MED ORDER — LACTATED RINGERS IV BOLUS
1000.0000 mL | Freq: Once | INTRAVENOUS | Status: AC
  Administered 2021-06-28: 1000 mL via INTRAVENOUS

## 2021-06-28 MED ORDER — BETAMETHASONE SOD PHOS & ACET 6 (3-3) MG/ML IJ SUSP
12.0000 mg | Freq: Once | INTRAMUSCULAR | Status: AC
  Administered 2021-06-28: 12 mg via INTRAMUSCULAR
  Filled 2021-06-28: qty 5

## 2021-06-28 NOTE — MAU Note (Addendum)
..  Karen Yang is a 26 y.o. at [redacted]w[redacted]d here in MAU reporting: Receives prenatal care at [facility] reports her due date is 08/18/2021.  Reports she had her baby shower today. During the party her belly tightened and then the pain moved up to her chest and gave her the urge to vomit. The pain eventually went away but then when she got home she had it again. Now she feels a little bit of tightening and cramps but not like the pain from earlier.  On the way to the hospital she started having a headache.  Denies vaginal bleeding or leaking of fluid. +FM Onset of complaint: today Pain score: abdominal pain: 5/10; headache 2/10 Vitals:   06/28/21 2124  BP: (!) 129/57  Pulse: 72  Resp: 16  Temp: 98.2 F (36.8 C)  SpO2: 99%     TKP:[id number] in room 140's Lab orders placed from triage:  UA

## 2021-06-28 NOTE — MAU Provider Note (Signed)
History     CSN: [id number]  Arrival date and time: 06/28/21 2048   Event Date/Time   First Provider Initiated Contact with Patient 06/28/21 2138      Chief Complaint  Patient presents with   Abdominal Pain   Decreased Fetal Movement   Headache   Karen Yang is a 26 y.o. G1P0 at [redacted]w[redacted]d who receives care at [facility].  She presents today, with her SO, for Abdominal Pain.  Karen Yang reports she started having abdominal pain and tightening around 1630.  She states the pain is intermittent in onset, but is constant when it occurs.  She states she has had 3 incidents of the pain since onset.  She reports no relieving or aggravating factors for the pain.  She rates the pain a 4-5/10. She reports good fetal movement and denies vaginal bleeding or abnormal discharge. She reports drinking 2.5 bottles of water today. SO at bedside and contributes to history.    OB History     Gravida  1   Para  0   Term  0   Preterm  0   AB  0   Living  0      SAB  0   IAB  0   Ectopic  0   Multiple  0   Live Births  0           Past Medical History:  Diagnosis Date   Anemia    Blood transfusion without reported diagnosis    as an infant   Dysmenorrhea     Past Surgical History:  Procedure Laterality Date   wisdom teeth removal      Family History  Problem Relation Age of Onset   Thyroid disease Mother    Ovarian cysts Sister        half sister, aged 81 when cyst was removed    Social History   Tobacco Use   Smoking status: Never   Smokeless tobacco: Never  Vaping Use   Vaping Use: Never used  Substance Use Topics   Alcohol use: No   Drug use: No    Allergies: No Known Allergies  Medications Prior to Admission  Medication Sig Dispense Refill Last Dose   ferrous sulfate 324 MG TBEC Take 324 mg by mouth.   06/28/2021   prenatal vitamin w/FE, FA (PRENATAL 1 + 1) 27-1 MG TABS tablet Take 1 tablet by mouth daily at 12 noon.   06/28/2021   ibuprofen (ADVIL)  800 MG tablet Take 1 tablet (800 mg total) by mouth 3 (three) times daily. 21 tablet 0     Review of Systems  Constitutional:  Negative for chills and fever.  Gastrointestinal:  Positive for abdominal pain. Negative for nausea and vomiting.  Genitourinary:  Negative for difficulty urinating, dysuria, vaginal bleeding and vaginal discharge.  Neurological:  Positive for headaches. Negative for dizziness and light-headedness.  Physical Exam   Blood pressure (!) 129/57, pulse 72, temperature 98.2 F (36.8 C), temperature source Oral, resp. rate 16, height 5\' 3"  (1.6 m), weight 60.3 kg, SpO2 99 %.  Physical Exam Vitals reviewed. Exam conducted with a chaperone present.  Constitutional:      Appearance: She is well-developed.  HENT:     Head: Normocephalic and atraumatic.  Cardiovascular:     Rate and Rhythm: Normal rate and regular rhythm.  Pulmonary:     Effort: Pulmonary effort is normal. No respiratory distress.  Abdominal:     General: Bowel sounds are normal.  Comments: Gravid, Appears AGA  Genitourinary:    Labia:        Right: No tenderness.        Left: No tenderness.      Vagina: Vaginal discharge (Scant) present.     Cervix: Friability present. No cervical bleeding.     Uterus: Enlarged.      Comments: Speculum Exam: -Normal External Genitalia: Non tender, no apparent discharge at introitus.  -Vaginal Vault: Pink mucosa with good rugae. Scant amt white discharge -wet prep collected -Cervix:Pink, no lesions, cysts, or polyps. Friable and bleeds easily when touched.  Appears closed. No active bleeding from os-GC/CT collected -Bimanual Exam: Dilation: 2 Effacement (%): 80 Station: Ballotable Presentation: Vertex Exam by:: Gavin Pound, CNM Skin:    General: Skin is warm and dry.  Neurological:     Mental Status: She is alert and oriented to person, place, and time.  Psychiatric:        Mood and Affect: Mood normal.        Behavior: Behavior normal.    Fetal  Assessment 135 bpm, Mod Var, -Decels, +15x15 Accels Toco: Occasional   MAU Course   Results for orders placed or performed during the hospital encounter of 06/28/21 (from the past 24 hour(s))  Urinalysis, Routine w reflex microscopic Urine, Clean Catch     Status: Abnormal   Collection Time: 06/28/21  9:37 PM  Result Value Ref Range   Color, Urine YELLOW YELLOW   APPearance HAZY (A) CLEAR   Specific Gravity, Urine 1.005 1.005 - 1.030   pH 6.0 5.0 - 8.0   Glucose, UA NEGATIVE NEGATIVE mg/dL   Hgb urine dipstick NEGATIVE NEGATIVE   Bilirubin Urine NEGATIVE NEGATIVE   Ketones, ur NEGATIVE NEGATIVE mg/dL   Protein, ur NEGATIVE NEGATIVE mg/dL   Nitrite NEGATIVE NEGATIVE   Leukocytes,Ua LARGE (A) NEGATIVE   RBC / HPF 6-10 0 - 5 RBC/hpf   WBC, UA 21-50 0 - 5 WBC/hpf   Bacteria, UA FEW (A) NONE SEEN   Squamous Epithelial / LPF 6-10 0 - 5   Mucus PRESENT   Wet prep, genital     Status: None   Collection Time: 06/28/21  9:45 PM  Result Value Ref Range   Yeast Wet Prep HPF POC NONE SEEN NONE SEEN   Trich, Wet Prep NONE SEEN NONE SEEN   Clue Cells Wet Prep HPF POC NONE SEEN NONE SEEN   WBC, Wet Prep HPF POC <10 <10   Sperm NONE SEEN    Patient informed that the ultrasound is considered a limited OB ultrasound and is not intended to be a complete ultrasound exam.  Patient also informed that the ultrasound is not being completed with the intent of assessing for fetal or placental anomalies or any pelvic abnormalities.  Explained that the purpose of today's ultrasound is to assess for  presentation.  Patient acknowledges the purpose of the exam and the limitations of the study.  US reveals frank breech presentation with fetal head on maternal right side.   MDM PE Labs: EFM Start IV Assessment and Plan  26 year old G1P0  SIUP at 32.6 weeks Cat I FT Preterm  Contractions   -Exam performed and findings discussed. -Start IV and LR bolus. -Will give 541mL and reassess  contractions. -If they continue start procardia per protocol. -Also give BMZ dose. -Patient and husband questions addressed. -Informed that findings are concerning for preterm delivery, but not guaranteeing. -Encouraged to contact family and friends as they deem  appropriate. -NST reactive. -Monitor and reassess.  -Limited prenatal records, from [facility], reviewed in [employer].     Maryann Conners MSN, CNM 06/28/2021, 9:38 PM   Reassessment (12:12 AM)  -Patient with continued contractions despite 3 doses of procardia and IV bolus f/b continuous infusion. -Cervical exam with minimal change as effacement now 90% with BOW appreciated, which is change from 80%. -Discussed terbutaline dosing with patient and reviewed anticipated effects and side effects. -Considering admission for overnight observation.  -Dr. Gardiner Fanti consulted and informed of patient status, evaluation, interventions, and results. Advised: *Agree with management thus far.  *Agrees with recommendation for admission *MgSO4 bolus f/b infusion would also be within standard of care if primary ob desires. -Dr. Jerilynn Mages. Philis Pique contacted and informed of patient status, evaluation, interventions, results, and recommendation.  *Agrees with plan. *Questions appropriateness of MgSO4 bolus and informed of attending comments. Desires to proceed. *Also requests continuation of IV fluids and procardia dosing Q 6 hrs. -Patient updated on POC and has no questions. -BSUS performed and documented. Dr. Philis Pique updated. 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  -Admission orders placed with modifications. -Care assumed by Dr. Jerilynn Mages. [place]. -Nurses updated  Maryann Conners MSN, CNM Advanced Practice Provider, [facility]

## 2021-06-29 ENCOUNTER — Inpatient Hospital Stay (HOSPITAL_BASED_OUTPATIENT_CLINIC_OR_DEPARTMENT_OTHER): Payer: Medicaid Other

## 2021-06-29 DIAGNOSIS — O99013 Anemia complicating pregnancy, third trimester: Secondary | ICD-10-CM | POA: Diagnosis present

## 2021-06-29 DIAGNOSIS — Q513 Bicornate uterus: Secondary | ICD-10-CM | POA: Diagnosis not present

## 2021-06-29 DIAGNOSIS — O321XX0 Maternal care for breech presentation, not applicable or unspecified: Secondary | ICD-10-CM

## 2021-06-29 DIAGNOSIS — Z3689 Encounter for other specified antenatal screening: Secondary | ICD-10-CM

## 2021-06-29 DIAGNOSIS — Z3A32 32 weeks gestation of pregnancy: Secondary | ICD-10-CM | POA: Diagnosis not present

## 2021-06-29 DIAGNOSIS — O368130 Decreased fetal movements, third trimester, not applicable or unspecified: Secondary | ICD-10-CM | POA: Diagnosis present

## 2021-06-29 DIAGNOSIS — O3403 Maternal care for unspecified congenital malformation of uterus, third trimester: Secondary | ICD-10-CM | POA: Diagnosis present

## 2021-06-29 LAB — CBC
HCT: 31.7 % — ABNORMAL LOW (ref 36.0–46.0)
Hemoglobin: 9.8 g/dL — ABNORMAL LOW (ref 12.0–15.0)
MCH: 24.5 pg — ABNORMAL LOW (ref 26.0–34.0)
MCHC: 30.9 g/dL (ref 30.0–36.0)
MCV: 79.3 fL — ABNORMAL LOW (ref 80.0–100.0)
Platelets: 268 10*3/uL (ref 150–400)
RBC: 4 MIL/uL (ref 3.87–5.11)
RDW: 22.7 % — ABNORMAL HIGH (ref 11.5–15.5)
WBC: 13.8 10*3/uL — ABNORMAL HIGH (ref 4.0–10.5)
nRBC: 0 % (ref 0.0–0.2)

## 2021-06-29 MED ORDER — BETAMETHASONE SOD PHOS & ACET 6 (3-3) MG/ML IJ SUSP
12.0000 mg | Freq: Once | INTRAMUSCULAR | Status: AC
  Administered 2021-06-29: 12 mg via INTRAMUSCULAR

## 2021-06-29 MED ORDER — FERROUS SULFATE 325 (65 FE) MG PO TABS
324.0000 mg | ORAL_TABLET | Freq: Every day | ORAL | Status: DC
  Administered 2021-06-29 – 2021-06-30 (×2): 324 mg via ORAL
  Filled 2021-06-29 (×2): qty 1

## 2021-06-29 MED ORDER — DOCUSATE SODIUM 100 MG PO CAPS
100.0000 mg | ORAL_CAPSULE | Freq: Every day | ORAL | Status: DC
  Administered 2021-06-29 – 2021-06-30 (×2): 100 mg via ORAL
  Filled 2021-06-29 (×2): qty 1

## 2021-06-29 MED ORDER — ZOLPIDEM TARTRATE 5 MG PO TABS: 5.0000 mg | ORAL_TABLET | Freq: Every evening | ORAL | Status: DC | PRN

## 2021-06-29 MED ORDER — CALCIUM CARBONATE ANTACID 500 MG PO CHEW
2.0000 | CHEWABLE_TABLET | ORAL | Status: DC | PRN
  Administered 2021-06-29: 400 mg via ORAL
  Filled 2021-06-29: qty 2

## 2021-06-29 MED ORDER — NIFEDIPINE 10 MG PO CAPS
10.0000 mg | ORAL_CAPSULE | Freq: Four times a day (QID) | ORAL | Status: DC
  Administered 2021-06-29 – 2021-06-30 (×5): 10 mg via ORAL
  Filled 2021-06-29 (×5): qty 1

## 2021-06-29 MED ORDER — LACTATED RINGERS IV SOLN: INTRAVENOUS | Status: DC

## 2021-06-29 MED ORDER — LACTATED RINGERS IV SOLN
INTRAVENOUS | Status: DC
Start: 2021-06-29 — End: 2021-06-30

## 2021-06-29 MED ORDER — ACETAMINOPHEN 325 MG PO TABS: 650.0000 mg | ORAL_TABLET | ORAL | Status: DC | PRN

## 2021-06-29 MED ORDER — PRENATAL MULTIVITAMIN CH
1.0000 | ORAL_TABLET | Freq: Every day | ORAL | Status: DC
  Administered 2021-06-29 – 2021-06-30 (×2): 1 via ORAL
  Filled 2021-06-29 (×2): qty 1

## 2021-06-29 MED ORDER — MAGNESIUM SULFATE 40 GM/1000ML IV SOLN
2.0000 g/h | INTRAVENOUS | Status: DC
  Administered 2021-06-29 (×2): 2 g/h via INTRAVENOUS
  Filled 2021-06-29 (×2): qty 1000

## 2021-06-29 MED ORDER — MAGNESIUM SULFATE BOLUS VIA INFUSION
4.0000 g | Freq: Once | INTRAVENOUS | Status: AC
Start: 2021-06-29 — End: 2021-06-29
  Administered 2021-06-29: 4 g via INTRAVENOUS
  Filled 2021-06-29: qty 1000

## 2021-06-29 MED ORDER — SODIUM CHLORIDE 0.9 % IV SOLN
150.0000 mg | Freq: Once | INTRAVENOUS | Status: AC
  Administered 2021-06-30: 150 mg via INTRAVENOUS
  Filled 2021-06-29: qty 7.5

## 2021-06-29 MED ORDER — TERBUTALINE SULFATE 1 MG/ML IJ SOLN
0.2500 mg | Freq: Once | INTRAMUSCULAR | Status: AC
  Administered 2021-06-29: 0.25 mg via SUBCUTANEOUS
  Filled 2021-06-29: qty 1

## 2021-06-29 NOTE — Consult Note (Signed)
Neonatology Consult to Antenatal Patient:  I was asked by Dr. Henderson Cloud to see this patient in order to provide antenatal counseling due to prematurity.  Mrs. Fraser was admitted 6/4 at 32 6/[redacted] weeks GA. She is currently having active labor. Membranes intact. She is getting BMZ and IV Magnesium.  Infant breech at this time.  She had questions about HepB, EM eye ointment and Vitamin K.  I spoke with the patient and husband. We discussed the worst case of delivery in the next 1-2 days, including usual DR management, possible respiratory complications and need for support, IV access, feedings (mother desires breast feeding, which was encouraged), LOS, Mortality and Morbidity.  I also strongly encouraged administration of Vitamin K and educated them on benefits of others. She did have routine questions at this time which I answered. I/we would be glad to come back if they have more questions later.  Thank you for asking me to see this patient.  Dineen Kid Leary Roca, MD Neonatologist  The total length of face-to-face or floor/unit time for this encounter was 25 minutes. Counseling and/or coordination of care was 40 minutes of the above.

## 2021-06-29 NOTE — H&P (Addendum)
Please refer to full CNM note below.   Briefly this is a 26 y.o. G1P0000 [id number] with preterm contractions and slight amount of cervical change yesterday in MAU.  Pt admitted for 24 hour course of betamethasone and magnesium sulfate to help delay delivery for lung maturity.    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  If labor, will need C/S.     Prenatal Transfer Tool  Maternal Diabetes: No Genetic Screening: Declined Maternal Ultrasounds/Referrals: Normal but bicornuate uterus Fetal Ultrasounds or other Referrals:  None Maternal Substance Abuse:  No Significant Maternal Medications:  None except iron for anemia Significant Maternal Lab Results: None  Results for orders placed or performed during the hospital encounter of 06/28/21 (from the past 24 hour(s))  Urinalysis, Routine w reflex microscopic Urine, Clean Catch     Status: Abnormal   Collection Time: 06/28/21  9:37 PM  Result Value Ref Range   Color, Urine YELLOW YELLOW   APPearance HAZY (A) CLEAR   Specific Gravity, Urine 1.005 1.005 - 1.030   pH 6.0 5.0 - 8.0   Glucose, UA NEGATIVE NEGATIVE mg/dL   Hgb urine dipstick NEGATIVE NEGATIVE   Bilirubin Urine NEGATIVE NEGATIVE   Ketones, ur NEGATIVE NEGATIVE mg/dL   Protein, ur NEGATIVE NEGATIVE mg/dL   Nitrite NEGATIVE NEGATIVE   Leukocytes,Ua LARGE (A) NEGATIVE   RBC / HPF 6-10 0 - 5 RBC/hpf   WBC, UA 21-50 0 - 5 WBC/hpf   Bacteria, UA FEW (A) NONE SEEN   Squamous Epithelial / LPF 6-10 0 - 5   Mucus PRESENT   Wet prep, genital     Status: None   Collection Time: 06/28/21  9:45 PM  Result Value Ref Range   Yeast Wet Prep HPF POC NONE SEEN NONE SEEN   Trich, Wet Prep NONE SEEN NONE SEEN   Clue Cells Wet Prep HPF POC NONE SEEN NONE SEEN   WBC, Wet Prep HPF POC <10 <10   Sperm NONE SEEN   Type and screen [facility]     Status: None   Collection Time: 06/28/21 10:44 PM  Result Value Ref Range   ABO/RH(D) O POS    Antibody Screen NEG     Sample Expiration      07/01/2021,2359 Performed at [facility], [address] [place], [place] [address]   CBC     Status: Abnormal   Collection Time: 06/29/21  4:14 AM  Result Value Ref Range   WBC 13.8 (H) 4.0 - 10.5 K/uL   RBC 4.00 3.87 - 5.11 MIL/uL   Hemoglobin 9.8 (L) 12.0 - 15.0 g/dL   HCT 60.431.7 (L) 54.036.0 - 98.146.0 %   MCV 79.3 (L) 80.0 - 100.0 fL   MCH 24.5 (L) 26.0 - 34.0 pg   MCHC 30.9 30.0 - 36.0 g/dL   RDW 19.122.7 (H) 47.811.5 - 29.515.5 %   Platelets 268 150 - 400 K/uL   nRBC 0.0 0.0 - 0.2 %    FHTs before magnesium 120s, NST R and contractions q 10 Now 120s with gSTV, gLTV and no longer contractions.  A/P  G1P0000 [redacted]w[redacted]d with PTL and cervical change.  Admitted for BMZ and mag sulfate.  Will turn mag sulfate off at 24 hours. D/w pt need for C/S for breech if labor or need for delivery.  Will do iron infusion now for anemia.  Will order EFW US in case of delivery for Neo planning.  Anesthesia and Neo consults.  Loney Laurence   Chief Complaint  Patient presents with   Abdominal Pain   Decreased Fetal Movement   Headache   Lind Ausley is a 26 y.o. G1P0 at [redacted]w[redacted]d who receives care at [facility]. She presents today, with her SO, for Abdominal Pain. Rosi reports she started having abdominal pain and tightening around 1630. She states the pain is intermittent in onset, but is constant when it occurs. She states she has had 3 incidents of the pain since onset. She reports no relieving or aggravating factors for the pain. She rates the pain a 4-5/10. She reports good fetal movement and denies vaginal bleeding or abnormal discharge. She reports drinking 2.5 bottles of water today. SO at bedside and contributes to history.   OB History     Gravida Para Term Preterm AB Living   1 0 0 0 0 0    SAB IAB Ectopic Multiple Live Births    0 0 0 0 0           Past Medical History:  Diagnosis Date   Anemia    Blood transfusion without reported diagnosis    as an  infant   Dysmenorrhea         Past Surgical History:  Procedure Laterality Date   wisdom teeth removal          Family History  Problem Relation Age of Onset   Thyroid disease Mother    Ovarian cysts Sister    half sister, aged 94 when cyst was removed   Social History       Tobacco Use   Smoking status: Never   Smokeless tobacco: Never  Vaping Use   Vaping Use: Never used  Substance Use Topics   Alcohol use: No   Drug use: No   Allergies: No Known Allergies         Medications Prior to Admission  Medication Sig Dispense Refill Last Dose   ferrous sulfate 324 MG TBEC Take 324 mg by mouth.   06/28/2021   prenatal vitamin w/FE, FA (PRENATAL 1 + 1) 27-1 MG TABS tablet Take 1 tablet by mouth daily at 12 noon.   06/28/2021   ibuprofen (ADVIL) 800 MG tablet Take 1 tablet (800 mg total) by mouth 3 (three) times daily. 21 tablet 0    Review of Systems  Constitutional: Negative for chills and fever.  Gastrointestinal: Positive for abdominal pain. Negative for nausea and vomiting.  Genitourinary: Negative for difficulty urinating, dysuria, vaginal bleeding and vaginal discharge.  Neurological: Positive for headaches. Negative for dizziness and light-headedness.  Physical Exam  Blood pressure (!) 129/57, pulse 72, temperature 98.2 F (36.8 C), temperature source Oral, resp. rate 16, height 5\' 3"  (1.6 m), weight 60.3 kg, SpO2 99 %.  Physical Exam  Vitals reviewed. Exam conducted with a chaperone present.  Constitutional:  Appearance: She is well-developed.  HENT:  Head: Normocephalic and atraumatic.  Cardiovascular:  Rate and Rhythm: Normal rate and regular rhythm.  Pulmonary:  Effort: Pulmonary effort is normal. No respiratory distress.  Abdominal:  General: Bowel sounds are normal.  Comments: Gravid, Appears AGA  Genitourinary:  Labia:  Right: No tenderness.  Left: No tenderness.  Vagina: Vaginal discharge (Scant) present.  Cervix: Friability present. No cervical  bleeding.  Uterus: Enlarged.  Comments: Speculum Exam:  -Normal External Genitalia: Non tender, no apparent discharge at introitus.  -Vaginal Vault: Pink mucosa with good rugae. Scant amt white discharge -wet prep collected  -Cervix:Pink, no lesions, cysts,  or polyps. Friable and bleeds easily when touched. Appears closed. No active bleeding from os-GC/CT collected  -Bimanual Exam: Dilation: 2  Effacement (%): 80  Station: Ballotable  Presentation: Vertex  Exam by:: Gerrit Heck, CNM  Skin:  General: Skin is warm and dry.  Neurological:  Mental Status: She is alert and oriented to person, place, and time.  Psychiatric:  Mood and Affect: Mood normal.  Behavior: Behavior normal.   Fetal Assessment  135 bpm, Mod Var, -Decels, +15x15 Accels  Toco: Occasional  MAU Course   Lab Results Last 24 Hours       Results for orders placed or performed during the hospital encounter of 06/28/21 (from the past 24 hour(s))  Urinalysis, Routine w reflex microscopic Urine, Clean Catch Status: Abnormal   Collection Time: 06/28/21 9:37 PM  Result Value Ref Range   Color, Urine YELLOW YELLOW   APPearance HAZY (A) CLEAR   Specific Gravity, Urine 1.005 1.005 - 1.030   pH 6.0 5.0 - 8.0   Glucose, UA NEGATIVE NEGATIVE mg/dL   Hgb urine dipstick NEGATIVE NEGATIVE   Bilirubin Urine NEGATIVE NEGATIVE   Ketones, ur NEGATIVE NEGATIVE mg/dL   Protein, ur NEGATIVE NEGATIVE mg/dL   Nitrite NEGATIVE NEGATIVE   Leukocytes,Ua LARGE (A) NEGATIVE   RBC / HPF 6-10 0 - 5 RBC/hpf   WBC, UA 21-50 0 - 5 WBC/hpf   Bacteria, UA FEW (A) NONE SEEN   Squamous Epithelial / LPF 6-10 0 - 5   Mucus PRESENT   Wet prep, genital Status: None   Collection Time: 06/28/21 9:45 PM  Result Value Ref Range   Yeast Wet Prep HPF POC NONE SEEN NONE SEEN   Trich, Wet Prep NONE SEEN NONE SEEN   Clue Cells Wet Prep HPF POC NONE SEEN NONE SEEN   WBC, Wet Prep HPF POC <10 <10   Sperm NONE SEEN   Patient informed that the ultrasound  is considered a limited OB ultrasound and is not intended to be a complete ultrasound exam. Patient also informed that the ultrasound is not being completed with the intent of assessing for fetal or placental anomalies or any pelvic abnormalities. Explained that the purpose of today's ultrasound is to assess for presentation. Patient acknowledges the purpose of the exam and the limitations of the study.  US reveals frank breech presentation with fetal head on maternal right side.  MDM  PE  Labs:  EFM  Start IV  Assessment and Plan  26 year old G1P0  SIUP at 32.6 weeks  Cat I FT  Preterm Contractions  -Exam performed and findings discussed.  -Start IV and LR bolus.  -Will give and reassess contractions.  -If they continue start procardia per protocol.  -Also give BMZ dose.  -Patient and husband questions addressed.  -Informed that findings are concerning for preterm delivery, but not guaranteeing.  -Encouraged to contact family and friends as they deem appropriate.  -NST reactive.  -Monitor and reassess.  -Limited prenatal records, from [facility], reviewed in [employer].  Cherre Robins MSN, CNM  06/28/2021, 9:38 PM  Reassessment (12:12 AM)  -Patient with continued contractions despite 3 doses of procardia and IV bolus f/b continuous infusion.  -Cervical exam with minimal change as effacement now 90% with BOW appreciated, which is change from 80%.  -Discussed terbutaline dosing with patient and reviewed anticipated effects and side effects.  -Considering admission for overnight observation.  -Dr. Austin Miles consulted and informed of patient status, evaluation, interventions, and  results. Advised:  *Agree with management thus far.  *Agrees with recommendation for admission  *MgSO4 bolus f/b infusion would also be within standard of care if primary ob desires.  -Dr. Judie Petit. Henderson Cloud contacted and informed of patient status, evaluation, interventions, results, and  recommendation.  *Agrees with plan.  *Questions appropriateness of MgSO4 bolus and informed of attending comments. Desires to proceed.  *Also requests continuation of IV fluids and procardia dosing Q 6 hrs.  -Patient updated on POC and has no questions.  -BSUS performed and documented. Dr. Henderson Cloud updated.  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  -Admission orders placed with modifications.  -Care assumed by Dr. Judie Petit. Kartik Fernando.  -Nurses updated  Cherre Robins MSN, CNM  Advanced Practice Provider

## 2021-06-29 NOTE — MAU Note (Signed)
NICU notified of pt's admission to [facility]

## 2021-06-29 NOTE — Progress Notes (Signed)
Pt stable all day.  Vitals:   06/29/21 1018 06/29/21 1154 06/29/21 1155 06/29/21 1547  BP:  122/62  128/70  Pulse:  98  99  Resp: 16 16  16   Temp:  97.6 F (36.4 C)  98 F (36.7 C)  TempSrc:  Oral  Oral  SpO2: 99% 100% 100% 100%  Weight:      Height:        FHTs 120s, gSTV, NST R. Toco q 7-15 again  MFM 1985 gm, 29%ile, funneling of cervix seen.  AFI 23. Breech.  A/p.  S/p first dose BMZ.  Mag sulfate off at 0130 tonight, will start iron infusion after that.  Pt is on regular diet, will have her NPO after dinner tonight.  S/p anethesia and NICU consults.

## 2021-06-30 LAB — GC/CHLAMYDIA PROBE AMP (~~LOC~~) NOT AT ARMC
Chlamydia: NEGATIVE
Comment: NEGATIVE
Comment: NORMAL
Neisseria Gonorrhea: NEGATIVE

## 2021-06-30 LAB — TYPE AND SCREEN
ABO/RH(D): O POS
Antibody Screen: NEGATIVE

## 2021-06-30 MED ORDER — POLYSACCHARIDE IRON COMPLEX 150 MG PO CAPS: 150.0000 mg | ORAL_CAPSULE | Freq: Every day | ORAL | Status: DC

## 2021-06-30 MED ORDER — NIFEDIPINE 10 MG PO CAPS: 10.0000 mg | ORAL_CAPSULE | ORAL | 1 refills | Status: DC | PRN

## 2021-06-30 MED ORDER — NIFEDIPINE 10 MG PO CAPS
10.0000 mg | ORAL_CAPSULE | ORAL | Status: DC | PRN
  Administered 2021-06-30: 10 mg via ORAL
  Filled 2021-06-30: qty 1

## 2021-06-30 NOTE — Progress Notes (Signed)
HD #2, [redacted]W[redacted]D, threatened PTL Feeling ok, no significant ctx Afeb, VSS FHT-125-130, reactive, occ ctx Abd- NT  S/p BMZ and magnesium, no regular ctx.  Breech presentation, bicornuate uterus.  Since not having ctx will allow to eat and ambulate and see how she does, saline lock IV, procardia prn.  Also received IV iron for anemia, will do Niferex daily also

## 2021-06-30 NOTE — Progress Notes (Signed)
No regular ctx today D/c home

## 2021-06-30 NOTE — Discharge Instructions (Signed)
Call for regular ctx, bleeding, gush of fluid

## 2021-06-30 NOTE — Plan of Care (Signed)

## 2021-07-01 LAB — CULTURE, OB URINE: Culture: 20000 — AB

## 2021-07-05 NOTE — Discharge Summary (Signed)
Physician Discharge Summary  Patient ID: Karen Yang MRN: [id number] DOB/AGE: [birth year] 25 y.o.  Admit date: 06/28/2021 Discharge date: 06/30/2021  Admission Diagnoses:  IUP at 32 weeks, preterm labor  Discharge Diagnoses: Same Principal Problem:   Preterm labor Active Problems:   [redacted] weeks gestation of pregnancy   Breech presentation, no version   NST (non-stress test) reactive   Discharged Condition: good  Hospital Course: Pt was admitted by Dr. Henderson Cloud due to contractions, possible cervical change, breech presentation with bicornuate uterus.  She received magnesium for 24 hrs as well as procardia, betamethasone also for fetal lung maturity.  On  6-5, ctx had mostly resolved and did not increase with ambulation, so she was felt to be stable for discharge  Discharge Exam: Blood pressure 125/64, pulse (!) 101, temperature 98.4 F (36.9 C), temperature source Oral, resp. rate 19, height 5\' 3"  (1.6 m), weight 60.3 kg, SpO2 98 %. General appearance: alert  Disposition: Discharge disposition: 01-Home or Self Care       Discharge Instructions     Notify physician for increase or change in vaginal discharge   Complete by: As directed    Notify physician for leaking of fluid   Complete by: As directed    Notify physician for uterine contractions.  These may be painless and feel like the uterus is tightening or the baby is  "balling up"   Complete by: As directed    Notify physician for vaginal bleeding   Complete by: As directed    PRETERM LABOR:  Includes any of the follwing symptoms that occur between 20 - [redacted] weeks gestation.  If these symptoms are not stopped, preterm labor can result in preterm delivery, placing your baby at risk   Complete by: As directed       Allergies as of 06/30/2021   No Known Allergies      Medication List     STOP taking these medications    ibuprofen 800 MG tablet Commonly known as: ADVIL       TAKE these medications    ferrous  sulfate 324 MG Tbec Take 324 mg by mouth.   NIFEdipine 10 MG capsule Commonly known as: PROCARDIA Take 1 capsule (10 mg total) by mouth every 4 (four) hours as needed (ctx).   prenatal vitamin w/FE, FA 27-1 MG Tabs tablet Take 1 tablet by mouth daily at 12 noon.        Follow-up Information     Shivaji, 08/30/2021, MD Follow up.   Specialty: Obstetrics and Gynecology Why: as scheduled Contact information: [address] [place] [address] [place] [phone]                 Signed: [phone] Karen Yang 07/05/2021, 9:27 AM

## 2021-07-16 ENCOUNTER — Inpatient Hospital Stay (HOSPITAL_COMMUNITY)
Admission: AD | Admit: 2021-07-16 | Discharge: 2021-07-16 | Disposition: A | Discharge status: Home / Self Care | Payer: Medicaid Other | Attending: Obstetrics and Gynecology | Admitting: Obstetrics and Gynecology

## 2021-07-16 ENCOUNTER — Other Ambulatory Visit: Payer: Self-pay

## 2021-07-16 DIAGNOSIS — O4703 False labor before 37 completed weeks of gestation, third trimester: Secondary | ICD-10-CM | POA: Diagnosis not present

## 2021-07-16 DIAGNOSIS — O321XX0 Maternal care for breech presentation, not applicable or unspecified: Secondary | ICD-10-CM | POA: Insufficient documentation

## 2021-07-16 DIAGNOSIS — Z3A35 35 weeks gestation of pregnancy: Secondary | ICD-10-CM | POA: Insufficient documentation

## 2021-07-16 LAB — URINALYSIS, ROUTINE W REFLEX MICROSCOPIC
Bilirubin Urine: NEGATIVE
Glucose, UA: NEGATIVE mg/dL
Hgb urine dipstick: NEGATIVE
Ketones, ur: NEGATIVE mg/dL
Nitrite: NEGATIVE
Protein, ur: NEGATIVE mg/dL
Specific Gravity, Urine: 1.004 — ABNORMAL LOW (ref 1.005–1.030)
pH: 6 (ref 5.0–8.0)

## 2021-07-16 MED ORDER — NIFEDIPINE 10 MG PO CAPS
10.0000 mg | ORAL_CAPSULE | ORAL | Status: DC | PRN
  Administered 2021-07-16 (×3): 10 mg via ORAL
  Filled 2021-07-16 (×2): qty 1

## 2021-07-16 NOTE — MAU Note (Signed)
Patient is being discharged home by the provider. Discharged instructions with labor precautions reviewed with patient. Patient provided reassurance that labor is not imminent at the time. SVE was unchanged from previous check. No evidence of LOF. Patient verbalized understanding and signed AVS with RN.

## 2021-07-16 NOTE — MAU Provider Note (Signed)
Chief Complaint:  Contractions and Rupture of Membranes   Event Date/Time   First Provider Initiated Contact with Patient 07/16/21 1903      HPI: Karen Yang is a 26 y.o. G1P0000 at [redacted]w[redacted]d who presents to maternity admissions reporting onset of intermittent upper abdominal/rib pain last night that improved this morning but restarted again today.  She was seen in the office and told she was having contractions on the monitor. She was 2 cm dilated and 90% effaced at 32 weeks.  She did not take the Procardia 10 mg at home since she was not sure she was having contractions. She denies any leaking fluid requiring a pad or pantyliner, but her underwear is often wet x several weeks.   She reports good fetal movement, denies vaginal bleeding, vaginal itching/burning, urinary symptoms, h/a, dizziness, n/v, or fever/chills.     HPI  Past Medical History: Past Medical History:  Diagnosis Date   Anemia    Blood transfusion without reported diagnosis    as an infant   Dysmenorrhea     Past obstetric history: OB History  Gravida Para Term Preterm AB Living  1 0 0 0 0 0  SAB IAB Ectopic Multiple Live Births  0 0 0 0 0    # Outcome Date GA Lbr Len/2nd Weight Sex Delivery Anes PTL Lv  1 Current             Past Surgical History: Past Surgical History:  Procedure Laterality Date   wisdom teeth removal      Family History: Family History  Problem Relation Age of Onset   Thyroid disease Mother    Ovarian cysts Sister        half sister, aged 53 when cyst was removed    Social History: Social History   Tobacco Use   Smoking status: Never   Smokeless tobacco: Never  Vaping Use   Vaping Use: Never used  Substance Use Topics   Alcohol use: No   Drug use: No    Allergies: No Known Allergies  Meds:  Medications Prior to Admission  Medication Sig Dispense Refill Last Dose   ferrous sulfate 324 MG TBEC Take 324 mg by mouth.      NIFEdipine (PROCARDIA) 10 MG capsule Take 1  capsule (10 mg total) by mouth every 4 (four) hours as needed (ctx). 30 capsule 1    prenatal vitamin w/FE, FA (PRENATAL 1 + 1) 27-1 MG TABS tablet Take 1 tablet by mouth daily at 12 noon.       ROS:  Review of Systems  Constitutional:  Negative for chills, fatigue and fever.  Eyes:  Negative for visual disturbance.  Respiratory:  Negative for shortness of breath.   Cardiovascular:  Negative for chest pain.  Gastrointestinal:  Positive for abdominal pain. Negative for nausea and vomiting.  Genitourinary:  Positive for vaginal discharge. Negative for difficulty urinating, dysuria, flank pain, pelvic pain, vaginal bleeding and vaginal pain.  Neurological:  Negative for dizziness and headaches.  Psychiatric/Behavioral: Negative.       I have reviewed patient's Past Medical Hx, Surgical Hx, Family Hx, Social Hx, medications and allergies.   Physical Exam  Patient Vitals for the past 24 hrs:  BP Temp Temp src Pulse Resp SpO2 Height Weight  07/16/21 2156 134/74 -- -- (!) 102 -- 98 % -- --  07/16/21 2135 -- -- -- -- -- 98 % -- --  07/16/21 2130 -- -- -- -- -- 99 % -- --  07/16/21 2125 131/70 -- -- 96 -- 100 % -- --  07/16/21 2120 -- -- -- -- -- 99 % -- --  07/16/21 2115 -- -- -- -- -- 99 % -- --  07/16/21 2110 -- -- -- -- -- 99 % -- --  07/16/21 2105 -- -- -- -- -- 99 % -- --  07/16/21 2100 -- -- -- -- -- 100 % -- --  07/16/21 2055 -- -- -- -- -- 100 % -- --  07/16/21 2050 -- -- -- -- -- 100 % -- --  07/16/21 2045 -- -- -- -- -- 100 % -- --  07/16/21 2040 -- -- -- -- -- 99 % -- --  07/16/21 2035 -- -- -- -- -- 99 % -- --  07/16/21 2030 -- -- -- -- -- 99 % -- --  07/16/21 2025 -- -- -- -- -- 100 % -- --  07/16/21 2020 -- -- -- -- -- 100 % -- --  07/16/21 2015 -- -- -- -- -- 100 % -- --  07/16/21 2010 -- -- -- -- -- 100 % -- --  07/16/21 2005 -- -- -- -- -- 99 % -- --  07/16/21 2000 -- -- -- -- -- 100 % -- --  07/16/21 1955 -- -- -- -- -- 100 % -- --  07/16/21 1950 -- -- -- -- --  100 % -- --  07/16/21 1945 -- -- -- -- -- 100 % -- --  07/16/21 1940 -- -- -- -- -- 100 % -- --  07/16/21 1935 -- -- -- -- -- 100 % -- --  07/16/21 1930 -- -- -- -- -- 100 % -- --  07/16/21 1925 -- -- -- -- -- 100 % -- --  07/16/21 1920 -- -- -- -- -- 100 % -- --  07/16/21 1915 -- -- -- -- -- 99 % -- --  07/16/21 1910 -- -- -- -- -- 99 % -- --  07/16/21 1905 -- -- -- -- -- 98 % -- --  07/16/21 1900 -- -- -- -- -- 98 % -- --  07/16/21 1855 -- -- -- -- -- 97 % -- --  07/16/21 1850 -- -- -- -- -- 98 % -- --  07/16/21 1845 -- -- -- -- -- 97 % -- --  07/16/21 1840 -- -- -- -- -- 97 % -- --  07/16/21 1835 -- -- -- -- -- 97 % -- --  07/16/21 1830 -- -- -- -- -- 96 % -- --  07/16/21 1825 -- -- -- -- -- 96 % -- --  07/16/21 1820 -- -- -- -- -- 97 % -- --  07/16/21 1810 -- -- -- -- -- 98 % -- --  07/16/21 1805 -- -- -- -- -- 99 % -- --  07/16/21 1800 -- -- -- -- -- 98 % -- --  07/16/21 1755 -- -- -- -- -- 98 % -- --  07/16/21 1750 -- -- -- -- -- 100 % -- --  07/16/21 1725 131/70 97.7 F (36.5 C) Oral 67 16 99 % -- --  07/16/21 1720 -- -- -- -- -- -- 5\' 3"  (1.6 m) 60.5 kg   Constitutional: Well-developed, well-nourished female in no acute distress.  Cardiovascular: normal rate Respiratory: normal effort GI: Abd soft, non-tender, gravid appropriate for gestational age.  MS: Extremities nontender, no edema, normal ROM Neurologic: Alert and oriented x 4.  GU: Neg CVAT.  PELVIC EXAM:   Dilation: 3.5 Effacement (%): 90  Station: [employer] Exam by:: Misty Stanley, CNM Mucus discharge on glove following exam, no fluid on pad or on glove following exam   Cervix rechecked in 3+ hours and remains 3-3.5/90/ballotable  FHT:  Baseline 135 , moderate variability, accelerations present, no decelerations Contractions: q 2-3 mins, mild to moderate to palpation   Labs: Results for orders placed or performed during the hospital encounter of 07/16/21 (from the past 24 hour(s))  Urinalysis, Routine w  reflex microscopic Urine, Clean Catch     Status: Abnormal   Collection Time: 07/16/21  5:36 PM  Result Value Ref Range   Color, Urine YELLOW YELLOW   APPearance HAZY (A) CLEAR   Specific Gravity, Urine 1.004 (L) 1.005 - 1.030   pH 6.0 5.0 - 8.0   Glucose, UA NEGATIVE NEGATIVE mg/dL   Hgb urine dipstick NEGATIVE NEGATIVE   Bilirubin Urine NEGATIVE NEGATIVE   Ketones, ur NEGATIVE NEGATIVE mg/dL   Protein, ur NEGATIVE NEGATIVE mg/dL   Nitrite NEGATIVE NEGATIVE   Leukocytes,Ua MODERATE (A) NEGATIVE   RBC / HPF 0-5 0 - 5 RBC/hpf   WBC, UA 11-20 0 - 5 WBC/hpf   Bacteria, UA RARE (A) NONE SEEN   Squamous Epithelial / LPF 6-10 0 - 5   Non Squamous Epithelial 0-5 (A) NONE SEEN   --/--/O POS (06/03 2244)  Imaging:   MAU Course/MDM: Orders Placed This Encounter  Procedures   Urinalysis, Routine w reflex microscopic Urine, Clean Catch   Discharge patient    Meds ordered this encounter  Medications   NIFEdipine (PROCARDIA) capsule 10 mg     NST reviewed and reactive No leaking fluid, unlikely ROM Cervix 3 cm on today's exam, slight change from previous exam, fetal station is ballotable and position remains breech Will give Procardia 10 mg x 3 doses and recheck pt cervix in 2 hours  Cervix unchanged at 3-3.5/90/ballotable in 3+ hours in MAU Contractions less frequent but still uncomfortable per pt Rest/ice/heat/warm bath/Tylenol/Procardia as prescribed at home D/C home, f/u as scheduled, return with worsening symptoms  Assessment: 1. Threatened preterm labor, third trimester   2. Breech presentation, single or unspecified fetus   3. [redacted] weeks gestation of pregnancy     Plan: Discharge home Labor precautions and fetal kick counts  Follow-up Information     Ob/Gyn, Nestor Ramp Follow up.   Why: As scheduled Contact information: [address] [place] [address] [place] [address] [phone]         [facility] Follow up.   Specialty: Obstetrics  and Gynecology Why: As needed for emergencies Contact information: [address] [id number] [place] [address] [phone]               Allergies as of 07/16/2021   No Known Allergies      Medication List     TAKE these medications    ferrous sulfate 324 MG Tbec Take 324 mg by mouth.   NIFEdipine 10 MG capsule Commonly known as: PROCARDIA Take 1 capsule (10 mg total) by mouth every 4 (four) hours as needed (ctx).   prenatal vitamin w/FE, FA 27-1 MG Tabs tablet Take 1 tablet by mouth daily at 12 noon.        Sharen Counter Certified Nurse-Midwife 07/16/2021 10:04 PM

## 2021-07-16 NOTE — Discharge Instructions (Signed)
Reasons to return to MAU at  [facility]:  Since you are preterm, return to MAU if:  1.  Contractions are 10 minutes apart or less and they becoming more uncomfortable or painful over time 2.  You have a large gush of fluid, or a trickle of fluid that will not stop and you have to wear a pad 3.  You have bleeding that is bright red, heavier than spotting--like menstrual bleeding (spotting can be normal in early labor or after a check of your cervix) 4.  You do not feel the baby moving like he/she normally does  

## 2021-07-16 NOTE — MAU Note (Signed)
Karen Yang is a 26 y.o. at [redacted]w[redacted]d here in MAU reporting: went to the office today because she was having pain in her ribs. They hooked her up to an NST and told her she was having contractions. Not sure about how often they were coming. They did not checked her cervix. No bleeding. Unsure if she is leaking, states underwear is always wet. +FM.   Onset of complaint: last night  Pain score: 3/10  Vitals:   07/16/21 1725  BP: 131/70  Pulse: 67  Resp: 16  Temp: 97.7 F (36.5 C)  SpO2: 99%     FHT:142  Lab orders placed from triage: UA

## 2021-07-24 ENCOUNTER — Inpatient Hospital Stay (HOSPITAL_COMMUNITY): Payer: Medicaid Other | Admitting: Anesthesiology

## 2021-07-24 ENCOUNTER — Encounter (HOSPITAL_COMMUNITY): Payer: Self-pay

## 2021-07-24 ENCOUNTER — Inpatient Hospital Stay (HOSPITAL_COMMUNITY)
Admission: AD | Admit: 2021-07-24 | Discharge: 2021-07-27 | DRG: 786 | Disposition: A | Discharge status: Home / Self Care | Payer: Medicaid Other | Attending: Obstetrics and Gynecology | Admitting: Obstetrics and Gynecology

## 2021-07-24 ENCOUNTER — Other Ambulatory Visit: Payer: Self-pay

## 2021-07-24 ENCOUNTER — Encounter (HOSPITAL_COMMUNITY)
Admission: AD | Disposition: A | Discharge status: Home / Self Care | Payer: Self-pay | Source: Home / Self Care | Attending: Obstetrics and Gynecology

## 2021-07-24 DIAGNOSIS — Z3A36 36 weeks gestation of pregnancy: Secondary | ICD-10-CM

## 2021-07-24 DIAGNOSIS — O3403 Maternal care for unspecified congenital malformation of uterus, third trimester: Secondary | ICD-10-CM | POA: Diagnosis present

## 2021-07-24 DIAGNOSIS — Z98891 History of uterine scar from previous surgery: Principal | ICD-10-CM

## 2021-07-24 DIAGNOSIS — O321XX0 Maternal care for breech presentation, not applicable or unspecified: Secondary | ICD-10-CM | POA: Diagnosis present

## 2021-07-24 DIAGNOSIS — O9081 Anemia of the puerperium: Secondary | ICD-10-CM | POA: Diagnosis not present

## 2021-07-24 DIAGNOSIS — D62 Acute posthemorrhagic anemia: Secondary | ICD-10-CM | POA: Diagnosis not present

## 2021-07-24 DIAGNOSIS — O9952 Diseases of the respiratory system complicating childbirth: Secondary | ICD-10-CM | POA: Diagnosis present

## 2021-07-24 DIAGNOSIS — J4520 Mild intermittent asthma, uncomplicated: Secondary | ICD-10-CM | POA: Diagnosis present

## 2021-07-24 DIAGNOSIS — Q513 Bicornate uterus: Secondary | ICD-10-CM

## 2021-07-24 DIAGNOSIS — Q5128 Other doubling of uterus, other specified: Secondary | ICD-10-CM

## 2021-07-24 DIAGNOSIS — O42913 Preterm premature rupture of membranes, unspecified as to length of time between rupture and onset of labor, third trimester: Secondary | ICD-10-CM | POA: Diagnosis present

## 2021-07-24 HISTORY — DX: Unspecified asthma, uncomplicated: J45.909

## 2021-07-24 LAB — RPR: RPR Ser Ql: NONREACTIVE

## 2021-07-24 LAB — CBC
HCT: 32.7 % — ABNORMAL LOW (ref 36.0–46.0)
Hemoglobin: 10.7 g/dL — ABNORMAL LOW (ref 12.0–15.0)
MCH: 26 pg (ref 26.0–34.0)
MCHC: 32.7 g/dL (ref 30.0–36.0)
MCV: 79.6 fL — ABNORMAL LOW (ref 80.0–100.0)
Platelets: 240 10*3/uL (ref 150–400)
RBC: 4.11 MIL/uL (ref 3.87–5.11)
RDW: 23.2 % — ABNORMAL HIGH (ref 11.5–15.5)
WBC: 8.9 10*3/uL (ref 4.0–10.5)
nRBC: 0 % (ref 0.0–0.2)

## 2021-07-24 LAB — TYPE AND SCREEN
ABO/RH(D): O POS
Antibody Screen: NEGATIVE

## 2021-07-24 LAB — POCT FERN TEST: POCT Fern Test: POSITIVE

## 2021-07-24 LAB — HEPATITIS B SURFACE ANTIGEN: Hepatitis B Surface Ag: NONREACTIVE

## 2021-07-24 SURGERY — Surgical Case
Anesthesia: Spinal

## 2021-07-24 MED ORDER — DIBUCAINE (PERIANAL) 1 % EX OINT: 1.0000 | TOPICAL_OINTMENT | CUTANEOUS | Status: DC | PRN

## 2021-07-24 MED ORDER — OXYCODONE HCL 5 MG PO TABS
5.0000 mg | ORAL_TABLET | ORAL | Status: DC | PRN
  Administered 2021-07-27: 5 mg via ORAL
  Filled 2021-07-24: qty 1

## 2021-07-24 MED ORDER — NALOXONE HCL 0.4 MG/ML IJ SOLN: 0.4000 mg | INTRAMUSCULAR | Status: DC | PRN

## 2021-07-24 MED ORDER — OXYTOCIN-SODIUM CHLORIDE 30-0.9 UT/500ML-% IV SOLN
INTRAVENOUS | Status: DC | PRN
  Administered 2021-07-24: 250 mL/h via INTRAVENOUS

## 2021-07-24 MED ORDER — ACETAMINOPHEN 10 MG/ML IV SOLN
INTRAVENOUS | Status: AC
  Filled 2021-07-24: qty 100

## 2021-07-24 MED ORDER — SODIUM CHLORIDE 0.9 % IV SOLN
INTRAVENOUS | Status: AC
  Filled 2021-07-24: qty 5

## 2021-07-24 MED ORDER — COCONUT OIL OIL: 1.0000 | TOPICAL_OIL | Status: DC | PRN

## 2021-07-24 MED ORDER — ZOLPIDEM TARTRATE 5 MG PO TABS: 5.0000 mg | ORAL_TABLET | Freq: Every evening | ORAL | Status: DC | PRN

## 2021-07-24 MED ORDER — DEXAMETHASONE SODIUM PHOSPHATE 10 MG/ML IJ SOLN
INTRAMUSCULAR | Status: AC
  Filled 2021-07-24: qty 1

## 2021-07-24 MED ORDER — DIPHENHYDRAMINE HCL 25 MG PO CAPS: 25.0000 mg | ORAL_CAPSULE | Freq: Four times a day (QID) | ORAL | Status: DC | PRN

## 2021-07-24 MED ORDER — PRENATAL MULTIVITAMIN CH
1.0000 | ORAL_TABLET | Freq: Every day | ORAL | Status: DC
Start: 2021-07-24 — End: 2021-07-27
  Administered 2021-07-25 – 2021-07-27 (×3): 1 via ORAL
  Filled 2021-07-24 (×3): qty 1

## 2021-07-24 MED ORDER — ACETAMINOPHEN 500 MG PO TABS: 1000.0000 mg | ORAL_TABLET | Freq: Once | ORAL | Status: DC

## 2021-07-24 MED ORDER — BUPIVACAINE IN DEXTROSE 0.75-8.25 % IT SOLN
INTRATHECAL | Status: DC | PRN
  Administered 2021-07-24: 1.4 mL via INTRATHECAL

## 2021-07-24 MED ORDER — SODIUM CHLORIDE 0.9% FLUSH: 3.0000 mL | Freq: Two times a day (BID) | INTRAVENOUS | Status: DC

## 2021-07-24 MED ORDER — ONDANSETRON HCL 4 MG/2ML IJ SOLN
INTRAMUSCULAR | Status: DC | PRN
  Administered 2021-07-24: 4 mg via INTRAVENOUS

## 2021-07-24 MED ORDER — FENTANYL CITRATE (PF) 100 MCG/2ML IJ SOLN
INTRAMUSCULAR | Status: AC
  Filled 2021-07-24: qty 2

## 2021-07-24 MED ORDER — PROPOFOL 10 MG/ML IV BOLUS
INTRAVENOUS | Status: AC
  Filled 2021-07-24: qty 20

## 2021-07-24 MED ORDER — ONDANSETRON HCL 4 MG/2ML IJ SOLN: 4.0000 mg | Freq: Three times a day (TID) | INTRAMUSCULAR | Status: DC | PRN

## 2021-07-24 MED ORDER — PHENYLEPHRINE HCL-NACL 20-0.9 MG/250ML-% IV SOLN
INTRAVENOUS | Status: DC | PRN
  Administered 2021-07-24: 60 ug/min via INTRAVENOUS

## 2021-07-24 MED ORDER — DIPHENHYDRAMINE HCL 25 MG PO CAPS: 25.0000 mg | ORAL_CAPSULE | ORAL | Status: DC | PRN

## 2021-07-24 MED ORDER — KETOROLAC TROMETHAMINE 30 MG/ML IJ SOLN
INTRAMUSCULAR | Status: AC
  Filled 2021-07-24: qty 1

## 2021-07-24 MED ORDER — SODIUM CHLORIDE 0.9% FLUSH
3.0000 mL | INTRAVENOUS | Status: DC | PRN
Start: 2021-07-24 — End: 2021-07-24

## 2021-07-24 MED ORDER — ACETAMINOPHEN 500 MG PO TABS
1000.0000 mg | ORAL_TABLET | Freq: Four times a day (QID) | ORAL | Status: DC
  Administered 2021-07-24 – 2021-07-27 (×11): 1000 mg via ORAL
  Filled 2021-07-24 (×12): qty 2

## 2021-07-24 MED ORDER — OXYTOCIN-SODIUM CHLORIDE 30-0.9 UT/500ML-% IV SOLN
2.5000 [IU]/h | INTRAVENOUS | Status: DC
  Administered 2021-07-24: 2.5 [IU]/h via INTRAVENOUS
  Filled 2021-07-24: qty 500

## 2021-07-24 MED ORDER — KETOROLAC TROMETHAMINE 30 MG/ML IJ SOLN
30.0000 mg | Freq: Once | INTRAMUSCULAR | Status: AC
Start: 2021-07-24 — End: 2021-07-24
  Administered 2021-07-24: 30 mg via INTRAVENOUS

## 2021-07-24 MED ORDER — DIPHENHYDRAMINE HCL 50 MG/ML IJ SOLN: 12.5000 mg | INTRAMUSCULAR | Status: DC | PRN

## 2021-07-24 MED ORDER — SODIUM CHLORIDE 0.9% FLUSH
3.0000 mL | INTRAVENOUS | Status: DC | PRN
Start: 2021-07-24 — End: 2021-07-27

## 2021-07-24 MED ORDER — IBUPROFEN 600 MG PO TABS
600.0000 mg | ORAL_TABLET | Freq: Four times a day (QID) | ORAL | Status: AC
  Administered 2021-07-24 – 2021-07-27 (×10): 600 mg via ORAL
  Filled 2021-07-24 (×10): qty 1

## 2021-07-24 MED ORDER — ACETAMINOPHEN 160 MG/5ML PO SOLN: 1000.0000 mg | Freq: Once | ORAL | Status: DC

## 2021-07-24 MED ORDER — OXYTOCIN-SODIUM CHLORIDE 30-0.9 UT/500ML-% IV SOLN
INTRAVENOUS | Status: AC
  Filled 2021-07-24: qty 500

## 2021-07-24 MED ORDER — SOD CITRATE-CITRIC ACID 500-334 MG/5ML PO SOLN
30.0000 mL | Freq: Once | ORAL | Status: AC
  Administered 2021-07-24: 30 mL via ORAL
  Filled 2021-07-24: qty 30

## 2021-07-24 MED ORDER — FAMOTIDINE IN NACL 20-0.9 MG/50ML-% IV SOLN
20.0000 mg | Freq: Once | INTRAVENOUS | Status: AC
  Administered 2021-07-24: 20 mg via INTRAVENOUS
  Filled 2021-07-24: qty 50

## 2021-07-24 MED ORDER — ONDANSETRON HCL 4 MG/2ML IJ SOLN
INTRAMUSCULAR | Status: AC
  Filled 2021-07-24: qty 2

## 2021-07-24 MED ORDER — LIDOCAINE 2% (20 MG/ML) 5 ML SYRINGE
INTRAMUSCULAR | Status: AC
  Filled 2021-07-24: qty 5

## 2021-07-24 MED ORDER — KETOROLAC TROMETHAMINE 30 MG/ML IJ SOLN: 30.0000 mg | Freq: Four times a day (QID) | INTRAMUSCULAR | Status: AC | PRN

## 2021-07-24 MED ORDER — CEFAZOLIN SODIUM-DEXTROSE 2-4 GM/100ML-% IV SOLN
2.0000 g | INTRAVENOUS | Status: AC
  Administered 2021-07-24: 2 g via INTRAVENOUS
  Filled 2021-07-24: qty 100

## 2021-07-24 MED ORDER — SENNOSIDES-DOCUSATE SODIUM 8.6-50 MG PO TABS
2.0000 | ORAL_TABLET | Freq: Every day | ORAL | Status: DC
  Administered 2021-07-25 – 2021-07-27 (×3): 2 via ORAL
  Filled 2021-07-24 (×3): qty 2

## 2021-07-24 MED ORDER — WITCH HAZEL-GLYCERIN EX PADS: 1.0000 | MEDICATED_PAD | CUTANEOUS | Status: DC | PRN

## 2021-07-24 MED ORDER — SIMETHICONE 80 MG PO CHEW: 80.0000 mg | CHEWABLE_TABLET | ORAL | Status: DC | PRN

## 2021-07-24 MED ORDER — ALBUMIN HUMAN 5 % IV SOLN
INTRAVENOUS | Status: AC
  Filled 2021-07-24: qty 250

## 2021-07-24 MED ORDER — LACTATED RINGERS IV SOLN: INTRAVENOUS | Status: DC

## 2021-07-24 MED ORDER — LACTATED RINGERS IV BOLUS
1000.0000 mL | Freq: Once | INTRAVENOUS | Status: AC
  Administered 2021-07-24: 1000 mL via INTRAVENOUS

## 2021-07-24 MED ORDER — SODIUM CHLORIDE 0.9 % IV SOLN
500.0000 mg | INTRAVENOUS | Status: AC
  Administered 2021-07-24: 500 mg via INTRAVENOUS

## 2021-07-24 MED ORDER — SIMETHICONE 80 MG PO CHEW
80.0000 mg | CHEWABLE_TABLET | Freq: Three times a day (TID) | ORAL | Status: DC
  Administered 2021-07-24 – 2021-07-27 (×9): 80 mg via ORAL
  Filled 2021-07-24 (×9): qty 1

## 2021-07-24 MED ORDER — ACETAMINOPHEN 500 MG PO TABS: 1000.0000 mg | ORAL_TABLET | Freq: Four times a day (QID) | ORAL | Status: DC

## 2021-07-24 MED ORDER — MORPHINE SULFATE (PF) 0.5 MG/ML IJ SOLN
INTRAMUSCULAR | Status: AC
  Filled 2021-07-24: qty 10

## 2021-07-24 MED ORDER — DOCUSATE SODIUM 100 MG PO CAPS
100.0000 mg | ORAL_CAPSULE | Freq: Two times a day (BID) | ORAL | Status: DC
  Administered 2021-07-24 – 2021-07-27 (×6): 100 mg via ORAL
  Filled 2021-07-24 (×6): qty 1

## 2021-07-24 MED ORDER — MENTHOL 3 MG MT LOZG: 1.0000 | LOZENGE | OROMUCOSAL | Status: DC | PRN

## 2021-07-24 MED ORDER — SODIUM CHLORIDE 0.9 % IV SOLN: 250.0000 mL | INTRAVENOUS | Status: DC | PRN

## 2021-07-24 MED ORDER — TETANUS-DIPHTH-ACELL PERTUSSIS 5-2.5-18.5 LF-MCG/0.5 IM SUSY: 0.5000 mL | PREFILLED_SYRINGE | Freq: Once | INTRAMUSCULAR | Status: DC

## 2021-07-24 MED ORDER — PHENYLEPHRINE HCL-NACL 20-0.9 MG/250ML-% IV SOLN
INTRAVENOUS | Status: AC
  Filled 2021-07-24: qty 250

## 2021-07-24 MED ORDER — FENTANYL CITRATE (PF) 100 MCG/2ML IJ SOLN
INTRAMUSCULAR | Status: DC | PRN
Start: 2021-07-24 — End: 2021-07-24
  Administered 2021-07-24: 15 ug via INTRATHECAL

## 2021-07-24 MED ORDER — NALOXONE HCL 4 MG/10ML IJ SOLN: 1.0000 ug/kg/h | INTRAVENOUS | Status: DC | PRN

## 2021-07-24 MED ORDER — ROCURONIUM BROMIDE 10 MG/ML (PF) SYRINGE
PREFILLED_SYRINGE | INTRAVENOUS | Status: AC
  Filled 2021-07-24: qty 10

## 2021-07-24 MED ORDER — DROPERIDOL 2.5 MG/ML IJ SOLN: 0.6250 mg | Freq: Once | INTRAMUSCULAR | Status: DC | PRN

## 2021-07-24 MED ORDER — MORPHINE SULFATE (PF) 0.5 MG/ML IJ SOLN
INTRAMUSCULAR | Status: DC | PRN
  Administered 2021-07-24: 150 ug via INTRATHECAL

## 2021-07-24 MED ORDER — FENTANYL CITRATE (PF) 100 MCG/2ML IJ SOLN: 25.0000 ug | INTRAMUSCULAR | Status: DC | PRN

## 2021-07-24 MED ORDER — DEXAMETHASONE SODIUM PHOSPHATE 10 MG/ML IJ SOLN
INTRAMUSCULAR | Status: DC | PRN
  Administered 2021-07-24: 10 mg via INTRAVENOUS

## 2021-07-24 SURGICAL SUPPLY — 36 items
BENZOIN TINCTURE PRP APPL 2/3 (GAUZE/BANDAGES/DRESSINGS) ×2 IMPLANT
CHLORAPREP W/TINT 26ML (MISCELLANEOUS) ×4 IMPLANT
CLAMP CORD UMBIL (MISCELLANEOUS) ×2 IMPLANT
CLOSURE STERI STRIP 1/2 X4 (GAUZE/BANDAGES/DRESSINGS) ×1 IMPLANT
CLOTH BEACON ORANGE TIMEOUT ST (SAFETY) ×2 IMPLANT
DRSG OPSITE POSTOP 4X10 (GAUZE/BANDAGES/DRESSINGS) ×2 IMPLANT
ELECT REM PT RETURN 9FT ADLT (ELECTROSURGICAL) ×2
ELECTRODE REM PT RTRN 9FT ADLT (ELECTROSURGICAL) ×1 IMPLANT
EXTRACTOR VACUUM KIWI (MISCELLANEOUS) IMPLANT
GLOVE BIOGEL M STER SZ 6 (GLOVE) ×2 IMPLANT
GLOVE BIOGEL PI IND STRL 6 (GLOVE) ×1 IMPLANT
GLOVE BIOGEL PI IND STRL 7.0 (GLOVE) ×1 IMPLANT
GLOVE BIOGEL PI INDICATOR 6 (GLOVE) ×1
GLOVE BIOGEL PI INDICATOR 7.0 (GLOVE) ×1
GOWN STRL REUS W/TWL LRG LVL3 (GOWN DISPOSABLE) ×4 IMPLANT
KIT ABG SYR 3ML LUER SLIP (SYRINGE) IMPLANT
NDL HYPO 25X5/8 SAFETYGLIDE (NEEDLE) IMPLANT
NEEDLE HYPO 25X5/8 SAFETYGLIDE (NEEDLE) IMPLANT
NS IRRIG 1000ML POUR BTL (IV SOLUTION) ×2 IMPLANT
PACK C SECTION WH (CUSTOM PROCEDURE TRAY) ×2 IMPLANT
PAD OB MATERNITY 4.3X12.25 (PERSONAL CARE ITEMS) ×2 IMPLANT
RTRCTR C-SECT PINK 25CM LRG (MISCELLANEOUS) ×2 IMPLANT
STRIP CLOSURE SKIN 1/2X4 (GAUZE/BANDAGES/DRESSINGS) ×2 IMPLANT
SUT MNCRL 0 VIOLET CTX 36 (SUTURE) ×2 IMPLANT
SUT MONOCRYL 0 CTX 36 (SUTURE) ×2
SUT PLAIN 0 NONE (SUTURE) IMPLANT
SUT PLAIN 2 0 (SUTURE) ×1
SUT PLAIN ABS 2-0 CT1 27XMFL (SUTURE) ×1 IMPLANT
SUT VIC AB 0 CTX 36 (SUTURE) ×2
SUT VIC AB 0 CTX36XBRD ANBCTRL (SUTURE) ×2 IMPLANT
SUT VIC AB 2-0 CT1 27 (SUTURE) ×1
SUT VIC AB 2-0 CT1 TAPERPNT 27 (SUTURE) ×1 IMPLANT
SUT VICRYL 4-0 PS2 18IN ABS (SUTURE) ×2 IMPLANT
TOWEL OR 17X24 6PK STRL BLUE (TOWEL DISPOSABLE) ×2 IMPLANT
TRAY FOLEY W/BAG SLVR 14FR LF (SET/KITS/TRAYS/PACK) ×2 IMPLANT
WATER STERILE IRR 1000ML POUR (IV SOLUTION) ×2 IMPLANT

## 2021-07-24 NOTE — H&P (Signed)
26 y.o. G1P0000 @ [redacted]w[redacted]d presents for to MAU with ROM at 0348.  She was ruled in for rupture of membranes with a positive fern. Contractions started following rupture of membranes.  Otherwise has good fetal movement and no bleeding.   Known bicornuate uterus with persistent breech presentation   Pregnancy complicated by: Threatened preterm labor at 94 weeks--admitted, received BMZ Bicornuate uterus Asthma, mild intermittent with rare inhaler use  Past Medical History:  Diagnosis Date   Anemia    Asthma    Stress induced   Blood transfusion without reported diagnosis    as an infant   Dysmenorrhea     Past Surgical History:  Procedure Laterality Date   wisdom teeth removal      OB History  Gravida Para Term Preterm AB Living  1 0 0 0 0 0  SAB IAB Ectopic Multiple Live Births  0 0 0 0 0    # Outcome Date GA Lbr Len/2nd Weight Sex Delivery Anes PTL Lv  1 Current             Social History   Socioeconomic History   Marital status: Married    Spouse name: Not on file   Number of children: Not on file   Years of education: Not on file   Highest education level: Not on file  Occupational History   Not on file  Tobacco Use   Smoking status: Never   Smokeless tobacco: Never  Vaping Use   Vaping Use: Never used  Substance and Sexual Activity   Alcohol use: No   Drug use: No   Sexual activity: Not Currently    Birth control/protection: None  Other Topics Concern   Not on file  Social History Narrative   Not on file   Social Determinants of Health   Financial Resource Strain: Not on file  Food Insecurity: Not on file  Transportation Needs: Not on file  Physical Activity: Not on file  Stress: Not on file  Social Connections: Not on file  Intimate Partner Violence: Not on file   Patient has no known allergies.    Prenatal Transfer Tool  Maternal Diabetes: No Genetic Screening: Normal Maternal Ultrasounds/Referrals: Normal Fetal Ultrasounds or other Referrals:   None Maternal Substance Abuse:  No Significant Maternal Medications:  None Significant Maternal Lab Results: None     Vitals:   07/24/21 0455 07/24/21 0508  BP: 131/83 132/70  Pulse: 81 84  Resp: 17 17  Temp: 97.9 F (36.6 C)   SpO2: 96% 99%     General:  NAD Abdomen:  soft, gravid Ex:  no edema SVE: 3/80/-2 per RN Toco: q3-4 minutes FHT: 130s, moderate variability, category 1  Limited bedside ultrasound confirms breech presentation   A/P   26 y.o. G1P0000 [redacted]w[redacted]d presents with PPROM and breech presentation. Known bicornuate uterus; ECV contraindicated. Plan for primary cesarean section    Discussed risks of cesarean section to include, but not limited to, infection, bleeding, damage to surrounding strutcures (including bowel, bladder, tubes, ovaries, nerves, vessels, baby), need for additional procedures, risk of blood clot, need for transfusion. Consent signed Ancef and azithromycin on call to OR   Rubella and hepatitis B missing from prenatal labs--will add  [facility] GEFFEL [facility]

## 2021-07-24 NOTE — Anesthesia Procedure Notes (Addendum)
Spinal  Patient location during procedure: OR Start time: 07/24/2021 7:18 AM End time: 07/24/2021 7:22 AM Reason for block: surgical anesthesia Staffing Performed: anesthesiologist  Anesthesiologist: Val Eagle, MD Performed by: Val Eagle, MD Authorized by: Val Eagle, MD   Preanesthetic Checklist Completed: patient identified, IV checked, risks and benefits discussed, surgical consent, monitors and equipment checked, pre-op evaluation and timeout performed Spinal Block Patient position: sitting Prep: DuraPrep Patient monitoring: heart rate, cardiac monitor, continuous pulse ox and blood pressure Approach: midline Location: L4-5 Injection technique: single-shot Needle Needle type: Pencan  Needle gauge: 24 G Needle length: 9 cm Assessment Sensory level: T4 Events: CSF return

## 2021-07-24 NOTE — Lactation Note (Addendum)
This note was copied from a baby's chart. Lactation Consultation Note  Patient Name: Karen Yang [id number] Date: 07/24/2021 Reason for consult: Initial assessment;Primapara;Late-preterm 34-36.6wks Age:26 hours  Infant was cueing; latch assist was done; infant was not able to maintain latch. A nipple shield (size 20) was applied and prefilled with a small amount of DBM. Consistent suckling was still not obtained.   Infant was bottle-fed. The Nfant Standard Flow nipple was too fast. Infant did very well the with Nfant Slow Flow nipple. Infant did have a coughing/choking episode at the end of the feeding, but infant handled it well without color change, etc. Attempts were made to communicate this with SLP, but were unsuccessful.   The volume goals for feeding (based on 40-60-80 ml/kg/d for the 1st three days are): 15 mL/feeding; 22 mL/feeding; & 29 mL/feeding, respectively. The LPI sheet was given to parents with the high points discussed.  Plan: Offer the breast. If infant latches well, stop nursing at 15 min mark and offer bottle. If infant does not latch well, only try to latch for a few minutes before discontinuing the attempt and offering a bottle. Mom to pump with size 21 flanges.   Maternal Data Has patient been taught Hand Expression?: No Does the patient have breastfeeding experience prior to this delivery?: No  Feeding Mother's Current Feeding Choice: Breast Milk and Donor Milk Nipple Type: Nfant Slow Flow (purple)  LATCH Score Latch: Grasps breast easily, tongue down, lips flanged, rhythmical sucking.  Audible Swallowing: None  Type of Nipple: Flat  Comfort (Breast/Nipple): Soft / non-tender  Hold (Positioning): Full assist, staff holds infant at breast  LATCH Score: 6   Lactation Tools Discussed/Used Tools: Pump;Flanges;Nipple Shields Nipple shield size: 20 Flange Size: 21 Breast pump type: Double-Electric Breast Pump Pumping frequency: q3h Pumped volume:   (some colostrum spilled when Mom was pumping)  Interventions Interventions: Education;Assisted with latch   Consult Status Consult Status: Follow-up Date: 07/25/21 Follow-up type: In-patient    Lurline Hare [facility] 07/24/2021, 3:16 PM

## 2021-07-24 NOTE — Progress Notes (Signed)
Patient ID: Karen Yang, female   DOB: 1995/04/20, 26 y.o.   MRN: [id number] Reviewed antenatal chart and is appears her HepBsAg and rubella were not ever resulted, will order now.

## 2021-07-24 NOTE — Anesthesia Preprocedure Evaluation (Addendum)
Anesthesia Evaluation  Patient identified by MRN, date of birth, ID band Patient awake    Reviewed: Allergy & Precautions, NPO status , Patient's Chart, lab work & pertinent test results  History of Anesthesia Complications Negative for: history of anesthetic complications  Airway Mallampati: I  TM Distance: >3 FB Neck ROM: Full    Dental no notable dental hx.    Pulmonary asthma ,    Pulmonary exam normal        Cardiovascular negative cardio ROS Normal cardiovascular exam     Neuro/Psych negative neurological ROS  negative psych ROS   GI/Hepatic negative GI ROS, Neg liver ROS,   Endo/Other  negative endocrine ROS  Renal/GU negative Renal ROS  negative genitourinary   Musculoskeletal negative musculoskeletal ROS (+)   Abdominal   Peds  Hematology negative hematology ROS (+)   Anesthesia Other Findings Day of surgery medications reviewed with patient.  Reproductive/Obstetrics (+) Pregnancy (breech presentation)                            Anesthesia Physical Anesthesia Plan  ASA: 2  Anesthesia Plan: Spinal   Post-op Pain Management:    Induction:   PONV Risk Score and Plan: 4 or greater and Treatment may vary due to age or medical condition, Ondansetron and Dexamethasone  Airway Management Planned: Natural Airway  Additional Equipment: None  Intra-op Plan:   Post-operative Plan:   Informed Consent: I have reviewed the patients History and Physical, chart, labs and discussed the procedure including the risks, benefits and alternatives for the proposed anesthesia with the patient or authorized representative who has indicated his/her understanding and acceptance.       Plan Discussed with: CRNA  Anesthesia Plan Comments:         Anesthesia Quick Evaluation

## 2021-07-24 NOTE — MAU Provider Note (Signed)
Limited OB US Date: 07/24/21 EDD : 08/18/21  based on early [place] Viability:  FHT detected  Fetal position: Breech position with head to maternal right noted on today's [place]  Pt informed that the ultrasound is considered a limited OB ultrasound and is not intended to be a complete ultrasound exam.  Patient also informed that the ultrasound is not being completed with the intent of assessing for fetal or placental anomalies or any pelvic abnormalities.  Explained that the purpose of today's ultrasound is to assess for  presentation.  Patient acknowledges the purpose of the exam and the limitations of the study.    RN labor check with ROM confirmed by fern slide  RN to call [facility] for admission

## 2021-07-24 NOTE — MAU Note (Addendum)
.  Karen Yang is a 26 y.o. at [redacted]w[redacted]d here in MAU reporting: SROM with clear fluid at 0348. Pt reports CTX started after, increased in intensity and frequency. Pt denies VB, DFM, PIH s/s, recent intercourse. BMZ given at 32 weeks   Baby is Breech SVE 3.5 yesterday  Onset of complaint: 0348 Pain score: 6/10 Vitals:   07/24/21 0455  BP: 131/83  Pulse: 81  Resp: 17  Temp: 97.9 F (36.6 C)  SpO2: 96%     FHT:140 Lab orders placed from triage:  mau labor

## 2021-07-24 NOTE — Anesthesia Postprocedure Evaluation (Signed)
Anesthesia Post Note  Patient: [name]  Procedure(s) Performed: CESAREAN SECTION     Patient location during evaluation: PACU Anesthesia Type: Spinal Level of consciousness: awake and alert Pain management: pain level controlled Vital Signs Assessment: post-procedure vital signs reviewed and stable Respiratory status: spontaneous breathing, nonlabored ventilation and respiratory function stable Cardiovascular status: stable and blood pressure returned to baseline Postop Assessment: no apparent nausea or vomiting and spinal receding Anesthetic complications: no   No notable events documented.  Last Vitals:  Vitals:   07/24/21 1201 07/24/21 1320  BP: 117/64 131/68  Pulse:    Resp:    Temp: 36.6 C 36.9 C  SpO2: 98% 98%    Last Pain:  Vitals:   07/24/21 1320  TempSrc:   PainSc: 3    Pain Goal: Patients Stated Pain Goal: 3 (07/24/21 1320)              Epidural/Spinal Function Cutaneous sensation: Normal sensation (07/24/21 1320), Patient able to flex knees: Yes (07/24/21 1320), Patient able to lift hips off bed: Yes (07/24/21 1320), Back pain beyond tenderness at insertion site: No (07/24/21 1320), Progressively worsening motor and/or sensory loss: No (07/24/21 1320), Bowel and/or bladder incontinence post epidural: No (07/24/21 1320)  Jabez Molner

## 2021-07-24 NOTE — Transfer of Care (Signed)
Immediate Anesthesia Transfer of Care Note  Patient: Karen Yang  Procedure(s) Performed: CESAREAN SECTION  Patient Location: PACU  Anesthesia Type:Spinal  Level of Consciousness: awake, alert  and oriented  Airway & Oxygen Therapy: Patient Spontanous Breathing  Post-op Assessment: Report given to RN and Post -op Vital signs reviewed and stable  Post vital signs: Reviewed and stable  Last Vitals:  Vitals Value Taken Time  BP 116/73   Temp    Pulse 82   Resp 14   SpO2 97%     Last Pain:  Vitals:   07/24/21 0455  TempSrc: Oral  PainSc:          Complications: No notable events documented.

## 2021-07-24 NOTE — Op Note (Signed)
Operative Note    Preoperative Diagnosis Preterm pregnancy at 36 3/7 weeks Breech presentation Preterm labor and ruptured membranes  Postoperative Diagnosis Same with uterine septum noted about 50% down through cavity  Procedure Primary low transverse c-section with two layer closure of uterus  Surgeon Huel Cote, MD Marlow Baars, MD  Anesthesia Spinal  Fluids: EBL UOP clear IVF  Findings There was a viable female infant in the frank breech presentation.  Apgars 9,9 Weight 6#7oz The uterus was carefully inspected and noted to have a thick septum extending about halfway down length of cavity.  Ovaries and tubes WNL.   Specimen Placenta to L&D  Procedure Note  Patient was taken to the operating room where spinal anesthesia was obtained and found to be adequate by Allis clamp test. She was prepped and draped in the normal sterile fashion in the dorsal supine position with a leftward tilt. An appropriate time out was performed. A Pfannenstiel skin incision was then made with the scalpel and carried through to the underlying layer of fascia by sharp dissection and Bovie cautery. The fascia was nicked in the midline and the incision was extended laterally with Mayo scissors. The inferior aspect of the incision was grasped Coker clamps and dissected off the underlying rectus muscles. In a similar fashion the superior aspect was dissected off the rectus muscles. Rectus muscles were separated in the midline and the peritoneal cavity entered bluntly. The peritoneal incision was then extended both superiorly and inferiorly with careful attention to avoid both bowel and bladder. The Alexis self-retaining wound retractor was then placed within the incision and the lower uterine segment exposed. The bladder flap was developed with Metzenbaum scissors and pushed away from the lower uterine segment. The lower uterine segment was then incised in a transverse fashion and the  cavity itself entered bluntly. The incision was extended bluntly. The infant's bottom was then lifted and delivered from the incision without difficulty. The remainder of the infant delivered easily with the arms reduced across the chest and the head delivering in flexed position.  The nose and mouth bulb suctioned with the cord clamped and cut as well after a one minute delay.  The infant was handed off to the waiting pediatricians. The placenta was then spontaneously expressed from the uterus and the uterus cleared of all clots and debris with moist lap sponge.  The uterus was inspected with the septum noted as described above.  The uterine incision was then repaired in 2 layers the first layer was a running locked layer 1-0 chromic and the second an imbricating layer of the same suture. The tubes and ovaries were inspected and the gutters cleared of all clots and debris. The uterine incision was inspected and found to be hemostatic. All instruments and sponges as well as the Alexis retractor were then removed from the abdomen. The rectus muscles and peritoneum were then reapproximated with a running suture of 2-0 Vicryl. The fascia was then closed with 0 Vicryl in a running fashion. Subcutaneous tissue was reapproximated with 3-0 plain in a running fashion. The skin was closed with a subcuticular stitch of 4-0 Vicryl on a Keith needle and then reinforced with benzoin and Steri-Strips. At the conclusion of the procedure all instruments and sponge counts were correct. Patient was taken to the recovery room in good condition with her baby accompanying her skin to skin.     D/w patient and husband circumcision and they decline.

## 2021-07-25 LAB — CBC
HCT: 27.4 % — ABNORMAL LOW (ref 36.0–46.0)
Hemoglobin: 8.8 g/dL — ABNORMAL LOW (ref 12.0–15.0)
MCH: 25.4 pg — ABNORMAL LOW (ref 26.0–34.0)
MCHC: 32.1 g/dL (ref 30.0–36.0)
MCV: 79 fL — ABNORMAL LOW (ref 80.0–100.0)
Platelets: 210 10*3/uL (ref 150–400)
RBC: 3.47 MIL/uL — ABNORMAL LOW (ref 3.87–5.11)
RDW: 22.8 % — ABNORMAL HIGH (ref 11.5–15.5)
WBC: 15.8 10*3/uL — ABNORMAL HIGH (ref 4.0–10.5)
nRBC: 0 % (ref 0.0–0.2)

## 2021-07-25 LAB — RUBELLA SCREEN: Rubella: <0.9 index — ABNORMAL LOW (ref >0.99)

## 2021-07-25 LAB — BIRTH TISSUE RECOVERY COLLECTION (PLACENTA DONATION)

## 2021-07-25 MED ORDER — PANTOPRAZOLE SODIUM 40 MG PO TBEC
40.0000 mg | DELAYED_RELEASE_TABLET | Freq: Once | ORAL | Status: AC
Start: 2021-07-25 — End: 2021-07-25
  Administered 2021-07-25: 40 mg via ORAL
  Filled 2021-07-25: qty 1

## 2021-07-25 NOTE — Progress Notes (Signed)
POSTPARTUM POSTOP PROGRESS NOTE  POD #1  Subjective:  No acute events overnight.  Pt denies problems with ambulating, voiding or po intake.  She denies nausea or vomiting.  Pain is well controlled.  She has had flatus. She has not had bowel movement.  Lochia Minimal.  After reported SOB last night (normal exam and VS), no complaints, IV protonix helped GERD symptoms  Objective: Blood pressure (!) 110/57, pulse 66, temperature 98.4 F (36.9 C), temperature source Oral, resp. rate 18, height 5\' 3"  (1.6 m), weight 62.5 kg, SpO2 96 %, unknown if currently breastfeeding.  Physical Exam:  General: alert, cooperative and no distress Lochia:normal flow Chest: CTAB Heart: RRR no m/r/g Abdomen: +BS, soft, nontender Uterine Fundus: firm, 3cm below umbilicus. Honeycomb dressing intact, minimal drainage Extremities: neg edema, neg calf TTP BL, neg Homans BL  Recent Labs    07/24/21 0540 07/25/21 0455  HGB 10.7* 8.8*  HCT 32.7* 27.4*    Assessment/Plan:  ASSESSMENT: Karen Yang is a 26 y.o. G1P0101 s/p PLTCS @ [redacted]w[redacted]d for SROM in setting of fetal malpresentaiton (breech). PNC c/b bicornuate uterus, asthma.   Plan for discharge tomorrow, Breastfeeding, and Lactation consult NO CIRC for baby boy   LOS: 1 day

## 2021-07-25 NOTE — Progress Notes (Signed)
Patient was complaining of shoulder pain and SOB 15 minutes after returning to bed after the restroom. This RN assessed patient and took vital signs that were within normal limits. This RN called Dr. Senaida Ores and received orders to give 40 mg of Protonix. This RN assessed patient after giving Protonix and patient felt better and was able to sleep.

## 2021-07-25 NOTE — Lactation Note (Signed)
This note was copied from a baby's chart. Lactation Consultation Note  Patient Name: Karen Yang [id number] Date: 07/25/2021 Reason for consult: Follow-up assessment;1st time breastfeeding;Primapara;Infant weight loss Age:26 hours Per mom the baby just finished breast feeding 15 mins with swallows and per mom comfortable. Dad changed baby. Wet and stool and plans to pace feed the baby 30 ml  donor milk and mom encouraged to pump both breast with the DEBP.   Maternal Data    Feeding Mother's Current Feeding Choice: Breast Milk and Donor Milk Nipple Type: Nfant Slow Flow (purple)  LATCH Score - LC unable to check the latch, baby had just finished.                     Lactation Tools Discussed/Used Tools:  (LC encouraged mom to pump)  Interventions Interventions: Breast feeding basics reviewed;DEBP;Education  Discharge    Consult Status Consult Status: Follow-up Date: 07/26/21 Follow-up type: In-patient    Matilde Sprang Cathan Gearin 07/25/2021, 2:47 PM

## 2021-07-26 MED ORDER — FERROUS SULFATE 325 (65 FE) MG PO TABS
325.0000 mg | ORAL_TABLET | Freq: Every day | ORAL | Status: DC
  Administered 2021-07-26 – 2021-07-27 (×2): 325 mg via ORAL
  Filled 2021-07-26 (×2): qty 1

## 2021-07-26 NOTE — Progress Notes (Signed)
Subjective: Postpartum Day 2: Cesarean Delivery Karen Yang is doing well this morning. Reports some incisional burning that is controlled with ibuprofen/tylenol. Ambulating, voiding, tolerating PO. Minimal lochia.   Objective: Patient Vitals for the past 24 hrs:  BP Temp Temp src Pulse Resp SpO2  07/26/21 0520 114/69 97.7 F (36.5 C) Oral 75 18 96 %  07/25/21 2030 121/70 98.9 F (37.2 C) Oral 70 18 --  07/25/21 1414 114/62 98.5 F (36.9 C) Oral 79 16 99 %   Physical Exam:  General: alert, cooperative, and no distress Lochia: appropriate Uterine Fundus: firm Incision: healing well, no significant drainage, no dehiscence, no significant erythema DVT Evaluation: No evidence of DVT seen on physical exam.  Recent Labs    07/24/21 0540 07/25/21 0455  HGB 10.7* 8.8*  HCT 32.7* 27.4*    Assessment/Plan:  Karen Yang G1P0101 POD#2 sp primary cesarean at [redacted]w[redacted]d for breech 1. PPC: routine PP care 2.  Rh pos 3. ABLA: hgb 8.8, start PO iron 4. Dispo: working on pain control and breastfeeding this AM. Will reevaluate for discharge home later today vs. Tomorrow.      Karen Yang 07/26/2021, 8:46 AM

## 2021-07-26 NOTE — Lactation Note (Signed)
This note was copied from a baby's chart. Lactation Consultation Note  Patient Name: Karen Yang [id number] Date: 07/26/2021 Reason for consult: Follow-up assessment;1st time breastfeeding;Primapara;Late-preterm 34-36.6wks Age:26 hours  LC in to visit with P1 Mom of LPTI.  Baby noted to be at a 6.3% weight loss with 1 stool and 5 voids last 24 hrs.  Bilirubin level 9.5 at 45 hrs, trending up, but below light level.  Baby with facial jaundice.  Mom states that baby just took 10 ml of donor breast milk.  Baby sleeping on his back currently.  Baby wiggling, and LC asked if she would like to latch baby to the breast, but Mom exhausted and planning to eat her lunch.  Baby settled down, changed a wet diaper.  Mom admitted to not pumping consistently.  LC provided Mom with a hands free pumping top and assisted her to pump.  Mom encouraged to relax her body and not to worry about what is in the bottle.  Mom reclined and closed her eyes.  Referred to rental in gift shop.  Mom was gifted with a Momcozy hands free pump.  Mom also has a hand pump.    Encouraged STS as much as possible.  Mom to continue to latch baby before supplementing, asking for help prn.  Encouraged Mom to consistently double pump to support her milk supply.   Lactation Tools Discussed/Used Tools: Pump;Flanges;Bottle;Hands-free pumping top Flange Size: 21 Breast pump type: Double-Electric Breast Pump;Manual Pump Education: Setup, frequency, and cleaning;Milk Storage Reason for Pumping: Support milk supply/LPTI Pumping frequency: Mom assisted to pump using the hands-free pumping top Pumped volume: 0 mL (drops)  Interventions Interventions: Breast feeding basics reviewed;Skin to skin;Breast massage;Hand express;Hand pump;DEBP;Education  Discharge Pump: Personal;Hands Free;Refer for rental [employer]ar[employer] manager)  Consult Status Consult Status: Follow-up Date: 07/27/21 Follow-up type: In-patient    Karen Yang 07/26/2021,  2:32 PM

## 2021-07-27 MED ORDER — ACETAMINOPHEN 500 MG PO TABS: 1000.0000 mg | ORAL_TABLET | Freq: Four times a day (QID) | ORAL | 0 refills | Status: DC | PRN

## 2021-07-27 MED ORDER — OXYCODONE HCL 5 MG PO TABS: 5.0000 mg | ORAL_TABLET | ORAL | 0 refills | Status: DC | PRN

## 2021-07-27 MED ORDER — IBUPROFEN 200 MG PO TABS: 600.0000 mg | ORAL_TABLET | Freq: Four times a day (QID) | ORAL | Status: DC | PRN

## 2021-07-27 NOTE — Discharge Summary (Signed)
Postpartum Discharge Summary  Date of Service updated 07/27/21      Patient Name: Karen Yang DOB: 12-15-1995 MRN: [id number]  Date of admission: 07/24/2021 Delivery date:07/24/2021  Delivering provider: Paula Compton  Date of discharge: 07/27/2021  Admitting diagnosis: S/P primary low transverse C-section [Z98.891] Intrauterine pregnancy: [redacted]w[redacted]d    Secondary diagnosis:  Principal Problem:   S/P primary low transverse C-section  Additional problems: breech presentation    Discharge diagnosis: Preterm Pregnancy Delivered and Anemia                                              Post partum procedures: none Augmentation: N/A Complications: None  Hospital course: Onset of Labor With C/S for breech 26y.o. yo G1P0101 at 375w3das admitted in Latent Labor with known persistent breech presentation on 07/24/2021. The patient went for cesarean section due to Malpresentation. Delivery details as follows: Membrane Rupture Time/Date: 3:48 AM ,07/24/2021   Delivery Method:C-Section, Low Transverse  Details of operation can be found in separate operative note. Patient had an uncomplicated postpartum course.  She is ambulating,tolerating a regular diet, passing flatus, and urinating well.  Patient is discharged home in stable condition 07/27/21.  Newborn Data: Birth date:07/24/2021  Birth time:7:44 AM  Gender:Female  Living status:Living  Apgars:9 ,9  Weight:2930 g   Magnesium Sulfate received: No BMZ received: No Rhophylac:N/A MMR:No T-DaP:Given prenatally Flu: No Transfusion:No  Physical exam  Vitals:   07/25/21 2030 07/26/21 0520 07/26/21 1635 07/26/21 2112  BP: 121/70 114/69 130/74 106/66  Pulse: 70 75 88 75  Resp: _0 Temp: 98.9 F (37.2 C) 97.7 F (36.5 C) 98.1 F (36.7 C) 98.8 F (37.1 C)  TempSrc: Oral Oral Oral Oral  SpO2:  96% 100% 100%  Weight:      Height:       General: alert, cooperative, and no distress Lochia: appropriate Uterine Fundus:  firm Incision: Healing well with no significant drainage DVT Evaluation: No evidence of DVT seen on physical exam. Labs: Lab Results  Component Value Date   WBC 15.8 (H) 07/25/2021   HGB 8.8 (L) 07/25/2021   HCT 27.4 (L) 07/25/2021   MCV 79.0 (L) 07/25/2021   PLT 210 07/25/2021      Latest Ref Rng & Units 04/21/2020    4:25 PM  CMP  Glucose 70 - 99 mg/dL 95   BUN 6 - 20 mg/dL 6   Creatinine 0.44 - 1.00 mg/dL 0.60   Sodium 135 - 145 mmol/L 137   Potassium 3.5 - 5.1 mmol/L 3.7   Chloride 98 - 111 mmol/L 109   CO2 22 - 32 mmol/L 23   Calcium 8.9 - 10.3 mg/dL 8.9   Total Protein 6.5 - 8.1 g/dL 7.2   Total Bilirubin 0.3 - 1.2 mg/dL 0.5   Alkaline Phos 38 - 126 U/L 65   AST 15 - 41 U/L 16   ALT 0 - 44 U/L 15    Edinburgh Score:    07/25/2021   11:19 AM  Edinburgh Postnatal Depression Scale Screening Tool  I have been able to laugh and see the funny side of things. 0  I have looked forward with enjoyment to things. 0  I have blamed myself unnecessarily when things went wrong. 1  I have been anxious or worried for no good reason. 0  I have felt scared or panicky for no good reason. 0  Things have been getting on top of me. 1  I have been so unhappy that I have had difficulty sleeping. 0  I have felt sad or miserable. 1  I have been so unhappy that I have been crying. 1  The thought of harming myself has occurred to me. 0  Edinburgh Postnatal Depression Scale Total 4      After visit meds:  Allergies as of 07/27/2021   No Known Allergies      Medication List     STOP taking these medications    NIFEdipine 10 MG capsule Commonly known as: PROCARDIA       TAKE these medications    acetaminophen 500 MG tablet Commonly known as: TYLENOL Take 2 tablets (1,000 mg total) by mouth every 6 (six) hours as needed.   docusate sodium 100 MG capsule Commonly known as: COLACE Take 100 mg by mouth 2 (two) times daily.   ferrous sulfate 324 MG Tbec Take 324 mg by  mouth.   ibuprofen 200 MG tablet Commonly known as: ADVIL Take 3 tablets (600 mg total) by mouth every 6 (six) hours as needed.   oxyCODONE 5 MG immediate release tablet Commonly known as: Oxy IR/ROXICODONE Take 1 tablet (5 mg total) by mouth every 4 (four) hours as needed for severe pain.   prenatal vitamin w/FE, FA 27-1 MG Tabs tablet Take 1 tablet by mouth daily at 12 noon.         Discharge home in stable condition Infant Feeding: Breast Infant Disposition:home with mother Discharge instruction: per After Visit Summary and Postpartum booklet. Activity: Advance as tolerated. Pelvic rest for 6 weeks.  Diet: routine diet Anticipated Birth Control: Unsure Postpartum Appointment:6 weeks Additional Postpartum F/U: Incision check 1-2 weeks Future Appointments:No future appointments. Follow up Visit:  [place], [facility]. Schedule an appointment as soon as possible for a visit.   Why: Incision check in 1-2 weeks Contact information: [address] [place] [address] [phone]                     07/27/2021 Rowland Lathe, MD

## 2021-07-27 NOTE — Progress Notes (Signed)
Subjective: Postpartum Day 3: Cesarean Delivery Karen Yang is doing well this morning. Pain controlled. Ambulating, voiding, tolerating PO. Breastfeeding.   Objective: Patient Vitals for the past 24 hrs:  BP Temp Temp src Pulse Resp SpO2  07/26/21 2112 106/66 98.8 F (37.1 C) Oral 75 17 100 %  07/26/21 1635 130/74 98.1 F (36.7 C) Oral 88 18 100 %     Physical Exam:  General: alert, cooperative, and no distress Lochia: appropriate Uterine Fundus: firm Incision: healing well, no significant drainage, no dehiscence, no significant erythema DVT Evaluation: No evidence of DVT seen on physical exam.  Recent Labs    07/25/21 0455  HGB 8.8*  HCT 27.4*    Assessment/Plan:  Karen Yang G1P0101 POD#3 sp primary cesarean at [redacted]w[redacted]d for breech 1. PPC: routine PP care 2. Rh pos 3. ABLA: hgb 8.8, continue PO iron 4. Dispo: plan discharge today   Karen Yang 07/27/2021, 10:01 AM

## 2021-07-27 NOTE — Lactation Note (Signed)
This note was copied from a baby's chart. Lactation Consultation Note  Patient Name: Karen Yang JOACZ'Y Date: 07/27/2021 Reason for consult: Follow-up assessment;Nipple pain/trauma;Late-preterm 34-36.6wks;1st time breastfeeding;Primapara Age:26 hours  LC in to visit with P1 Mom of LPTI on day of discharge.  Baby weight loss is at 6.5%, with a gain of 10 gms from yesterday.  Baby's output is great with transitional stools.  Mom feels baby is doing much better with bottle feeding.  Mom states she still offers the breast, but baby becomes sleepy after a few minutes.  Mom also complaining of sore nipples.  Mom denies any blistering or cracks.  Offered to help with a deeper latch or possibly a nipple shield, but Mom declined.  Mom states she doesn't want to give up, but she prefers bottle feeding and pumping.   Engorgement prevention and treatment reviewed. Reviewed OP lactation support and encouraged Mom to call, offering to send message and Mom reports she will call.  Mom aware of OP lactation support available and encouraged to call.   Lactation Tools Discussed/Used Tools: Pump;Flanges;Bottle Breast pump type: Double-Electric Breast Pump;Manual Pumping frequency: Encouraged to pump every 3 hrs whenever baby is supplemented Pumped volume: 5 mL  Interventions Interventions: Breast feeding basics reviewed;Skin to skin;Breast massage;Hand express;Pace feeding;DEBP;Hand pump  Discharge Discharge Education: Engorgement and breast care;Warning signs for feeding baby;Outpatient recommendation Pump: Hands Free;Personal;Manual WIC Program: No  Consult Status Consult Status: Complete Date: 07/27/21 Follow-up type: Call as needed    Karen Yang 07/27/2021, 11:06 AM

## 2021-08-01 ENCOUNTER — Telehealth (HOSPITAL_COMMUNITY): Payer: Self-pay | Admitting: *Deleted

## 2021-08-01 NOTE — Telephone Encounter (Signed)
Mom reports feeling good. Incision healing well per mom. No concerns regarding herself at this time. EPDS=3 (hospital score=4) Mom reports baby is well. Feeding, peeing, and pooping without difficulty. Reviewed safe sleep. Mom has no concerns about baby at present.  Duffy Rhody, RN 08-01-2021 at 10:04am

## 2021-08-18 ENCOUNTER — Inpatient Hospital Stay (HOSPITAL_COMMUNITY): Admit: 2021-08-18 | Payer: Self-pay

## 2021-10-07 ENCOUNTER — Ambulatory Visit
Admission: EM | Admit: 2021-10-07 | Discharge: 2021-10-07 | Disposition: A | Discharge status: Home / Self Care | Payer: Medicaid Other

## 2021-10-07 DIAGNOSIS — R040 Epistaxis: Secondary | ICD-10-CM

## 2021-10-07 NOTE — ED Notes (Addendum)
Nose bleeding started yesterday around 2 pm able to get it to stop after 10-15 mins with compression and has 3 more episodes on last night and two this morning.  Patient state she has a history of nose bleeds but never more 2 in a 24hr period. Having fascial pain under her eyes. Before nose bleeding started

## 2021-10-07 NOTE — ED Provider Notes (Signed)
[facility]    CSN: [id number] Arrival date & time: 10/07/21  1022    HISTORY   Chief Complaint  Patient presents with   Epistaxis   HPI Karen Yang is a pleasant, 26 y.o. female who presents to urgent care today. Patient complains of nose bleeding that started yesterday around 2 pm, states she was able to get it to stop after 10-15 mins with compression.  Reports 3 more episodes, one last night and two this morning.  Patient state she has a history of nose bleeds but never more than 2 in a 24hr period.  Patient states she is also having pain under her eyes which began before her nose bleeding started.  Vital signs normal on arrival today.  Patient states not actively having a nosebleed at this time.  The history is provided by the patient.   Past Medical History:  Diagnosis Date   Anemia    Asthma    Stress induced   Blood transfusion without reported diagnosis    as an infant   Dysmenorrhea    Patient Active Problem List   Diagnosis Date Noted   S/P primary low transverse C-section 07/24/2021   Preterm labor 06/29/2021   [redacted] weeks gestation of pregnancy    Breech presentation, no version    NST (non-stress test) reactive    Pelvic pain 06/08/2017   Dysmenorrhea    Anemia    Past Surgical History:  Procedure Laterality Date   CESAREAN SECTION N/A 07/24/2021   Procedure: CESAREAN SECTION;  Surgeon: Huel Cote, MD;  Location: [facility];  Service: Obstetrics;  Laterality: N/A;   wisdom teeth removal     OB History     Gravida  1   Para  1   Term  0   Preterm  1   AB  0   Living  1      SAB  0   IAB  0   Ectopic  0   Multiple  0   Live Births  1          Home Medications    Prior to Admission medications   Medication Sig Start Date End Date Taking? Authorizing Provider  ibuprofen (ADVIL) 200 MG tablet Take 3 tablets (600 mg total) by mouth every 6 (six) hours as needed. 07/27/21   Charlett Nose, MD    Family  History Family History  Problem Relation Age of Onset   Thyroid disease Mother    Ovarian cysts Sister        half sister, aged 11 when cyst was removed   Social History Social History   Tobacco Use   Smoking status: Never   Smokeless tobacco: Never  Vaping Use   Vaping Use: Never used  Substance Use Topics   Alcohol use: No   Drug use: No   Allergies   Patient has no known allergies.  Review of Systems Review of Systems Pertinent findings revealed after performing a 14 point review of systems has been noted in the history of present illness.  Physical Exam Triage Vital Signs ED Triage Vitals  Enc Vitals Group     BP 11/22/20 0827 (!) 147/82     Pulse Rate 11/22/20 0827 72     Resp 11/22/20 0827 18     Temp 11/22/20 0827 98.3 F (36.8 C)     Temp Source 11/22/20 0827 Oral     SpO2 11/22/20 0827 98 %     Weight --  Height --      Head Circumference --      Peak Flow --      Pain Score 11/22/20 0826 5     Pain Loc --      Pain Edu? --      Excl. in [place]? --   No data found.  Updated Vital Signs BP 107/71 (BP Location: Left Arm)   Pulse 63   Temp 97.9 F (36.6 C) (Oral)   SpO2 98%   Breastfeeding Yes   Physical Exam Deferred Visual Acuity Right Eye Distance:   Left Eye Distance:   Bilateral Distance:    Right Eye Near:   Left Eye Near:    Bilateral Near:     UC Couse / Diagnostics / Procedures:     Radiology No results found.  Procedures Procedures (including critical care time) EKG  Pending results:  Labs Reviewed - No data to display  Medications Ordered in UC: Medications - No data to display  UC Diagnoses / Final Clinical Impressions(s)   I have reviewed the triage vital signs and the nursing notes.  Pertinent labs & imaging results that were available during my care of the patient were reviewed by me and considered in my medical decision making (see chart for details).    Final diagnoses:  Epistaxis   Patient advised to  follow-up with ENT.  ED Prescriptions   None    PDMP not reviewed this encounter.  Pending results:  Labs Reviewed - No data to display  Discharge Instructions: Discharge Instructions   None     Disposition Upon Discharge:  Condition: stable for discharge home  Patient presented with an acute illness with associated systemic symptoms and significant discomfort requiring urgent management. In my opinion, this is a condition that a prudent lay person (someone who possesses an average knowledge of health and medicine) may potentially expect to result in complications if not addressed urgently such as respiratory distress, impairment of bodily function or dysfunction of bodily organs.   Routine symptom specific, illness specific and/or disease specific instructions were discussed with the patient and/or caregiver at length.   As such, the patient has been evaluated and assessed, work-up was performed and treatment was provided in alignment with urgent care protocols and evidence based medicine.  Patient/parent/caregiver has been advised that the patient may require follow up for further testing and treatment if the symptoms continue in spite of treatment, as clinically indicated and appropriate.  Patient/parent/caregiver has been advised to return to the [facility] or PCP if no better; to PCP or the Emergency Department if new signs and symptoms develop, or if the current signs or symptoms continue to change or worsen for further workup, evaluation and treatment as clinically indicated and appropriate  The patient will follow up with their current PCP if and as advised. If the patient does not currently have a PCP we will assist them in obtaining one.   The patient may need specialty follow up if the symptoms continue, in spite of conservative treatment and management, for further workup, evaluation, consultation and treatment as clinically indicated and appropriate.   Patient/parent/caregiver  verbalized understanding and agreement of plan as discussed.  All questions were addressed during visit.  Please see discharge instructions below for further details of plan.  This office note has been dictated using Museum/gallery curator.  Unfortunately, this method of dictation can sometimes lead to typographical or grammatical errors.  I apologize for your inconvenience in advance if  this occurs.  Please do not hesitate to reach out to me if clarification is needed.      Theadora Rama Scales, PA-C 10/07/21 1123

## 2022-03-16 IMAGING — CR DG FINGER RING 2+V*L*
3 series · 3 of 3 positions shown · non-contrast
Comparison: None.

CLINICAL DATA: Crush injury to the left ring finger

EXAM:
LEFT RING FINGER 2+V

[x finger pa left]
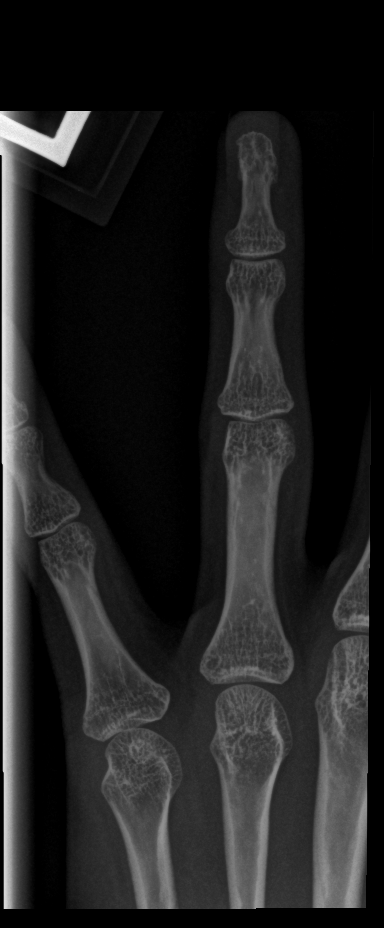

[x finger obl left]
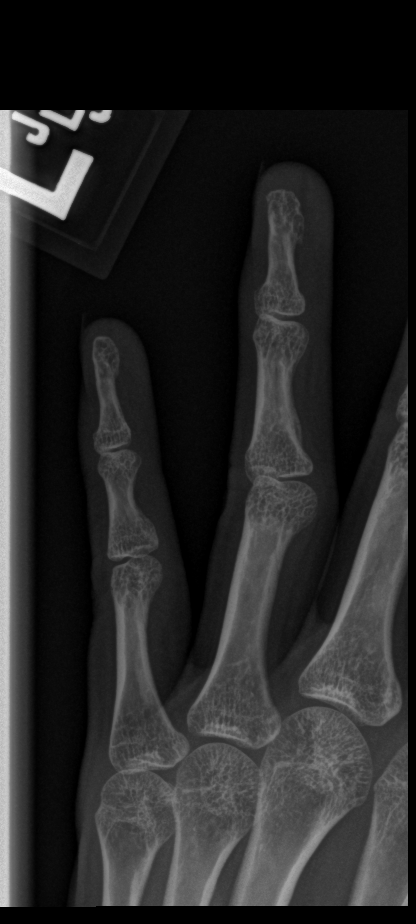

[x finger lat left]
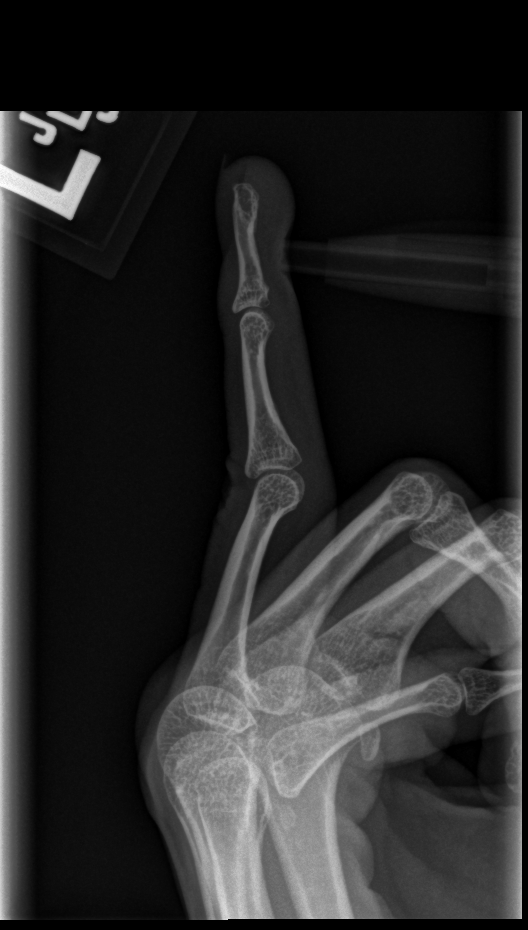

[3 of 3 positions shown; findings below may reference images not displayed]

FINDINGS: There is no evidence of fracture or dislocation. There is no
evidence of arthropathy or other focal bone abnormality. Soft
tissues are unremarkable.
IMPRESSION: Negative.

## 2022-06-23 LAB — OB RESULTS CONSOLE GBS
GBS: NEGATIVE
GBS: NEGATIVE

## 2022-12-17 DIAGNOSIS — Q513 Bicornate uterus: Secondary | ICD-10-CM | POA: Insufficient documentation

## 2022-12-17 LAB — OB RESULTS CONSOLE GC/CHLAMYDIA
Chlamydia: NEGATIVE
Neisseria Gonorrhea: NEGATIVE

## 2022-12-17 LAB — OB RESULTS CONSOLE ABO/RH: RH Type: POSITIVE

## 2022-12-17 LAB — OB RESULTS CONSOLE HEPATITIS B SURFACE ANTIGEN: Hepatitis B Surface Ag: NEGATIVE

## 2022-12-17 LAB — HEPATITIS C ANTIBODY: HCV Ab: NEGATIVE

## 2022-12-17 LAB — OB RESULTS CONSOLE HIV ANTIBODY (ROUTINE TESTING): HIV: NONREACTIVE

## 2022-12-17 LAB — OB RESULTS CONSOLE RPR: RPR: NONREACTIVE

## 2022-12-17 LAB — OB RESULTS CONSOLE RUBELLA ANTIBODY, IGM: Rubella: NON-IMMUNE/NOT IMMUNE

## 2023-01-23 ENCOUNTER — Inpatient Hospital Stay (HOSPITAL_COMMUNITY): Payer: Medicaid Other

## 2023-01-23 ENCOUNTER — Inpatient Hospital Stay (HOSPITAL_COMMUNITY)
Admission: AD | Admit: 2023-01-23 | Discharge: 2023-01-23 | Disposition: A | Discharge status: Home / Self Care | Payer: Medicaid Other | Attending: Obstetrics & Gynecology | Admitting: Obstetrics & Gynecology

## 2023-01-23 ENCOUNTER — Encounter (HOSPITAL_COMMUNITY): Payer: Self-pay

## 2023-01-23 DIAGNOSIS — O4692 Antepartum hemorrhage, unspecified, second trimester: Secondary | ICD-10-CM | POA: Diagnosis not present

## 2023-01-23 DIAGNOSIS — N841 Polyp of cervix uteri: Secondary | ICD-10-CM

## 2023-01-23 DIAGNOSIS — Q513 Bicornate uterus: Secondary | ICD-10-CM

## 2023-01-23 DIAGNOSIS — Z3A14 14 weeks gestation of pregnancy: Secondary | ICD-10-CM

## 2023-01-23 DIAGNOSIS — O34592 Maternal care for other abnormalities of gravid uterus, second trimester: Secondary | ICD-10-CM | POA: Insufficient documentation

## 2023-01-23 DIAGNOSIS — O3442 Maternal care for other abnormalities of cervix, second trimester: Secondary | ICD-10-CM | POA: Insufficient documentation

## 2023-01-23 DIAGNOSIS — N939 Abnormal uterine and vaginal bleeding, unspecified: Secondary | ICD-10-CM | POA: Diagnosis present

## 2023-01-23 DIAGNOSIS — R102 Pelvic and perineal pain: Secondary | ICD-10-CM | POA: Diagnosis present

## 2023-01-23 LAB — WET PREP, GENITAL
Clue Cells Wet Prep HPF POC: NONE SEEN
Sperm: NONE SEEN
Trich, Wet Prep: NONE SEEN
WBC, Wet Prep HPF POC: >=10 — AB (ref <10)
Yeast Wet Prep HPF POC: NONE SEEN

## 2023-01-23 LAB — URINALYSIS, ROUTINE W REFLEX MICROSCOPIC
Bacteria, UA: NONE SEEN
Bilirubin Urine: NEGATIVE
Glucose, UA: NEGATIVE mg/dL
Ketones, ur: NEGATIVE mg/dL
Leukocytes,Ua: NEGATIVE
Nitrite: NEGATIVE
Protein, ur: NEGATIVE mg/dL
Specific Gravity, Urine: 1.017 (ref 1.005–1.030)
pH: 5 (ref 5.0–8.0)

## 2023-01-23 NOTE — MAU Note (Signed)
.  Karen Yang is a 27 y.o. at Unknown here in MAU reporting: Vaginal bleeding since 0930 this morning with an odor. She reports intermittent mild lower abdominal cramping since the bleeding began. Had an [place] at 10w with [facility]. Was told she had a [facility]. Has had bleeding in this pregnancy but reports it has been over a month. Denies recent IC. Denies vaginal itching.  Onset of complaint: 0800 Pain score: 3/10 lower abdomen  Vitals:   01/23/23 1120  BP: 118/70  Pulse: 82  Resp: 16  Temp: 98.2 F (36.8 C)  SpO2: 100%      FHT: 153 doppler Lab orders placed from triage: UA

## 2023-01-23 NOTE — MAU Provider Note (Cosign Needed)
Chief Complaint: Vaginal Bleeding and Abdominal Pain  SUBJECTIVE HPI: Karen Yang is a 27 y.o. G2P0101 at [redacted]w[redacted]d by LMP who presents to maternity admissions reporting vaginal bleeding since 0930 this morning with an odor with associated intermittent mild lower abdominal cramping 3/10.  Known SIUP with [facility] per 10 week ultrasound at [facility] per patient report. Records not available in EMR.   She denies vaginal itching/burning, urinary symptoms, h/a, dizziness, n/v, or fever/chills.    HPI  Past Medical History:  Diagnosis Date   Anemia    Asthma    Stress induced   Blood transfusion without reported diagnosis    as an infant   Dysmenorrhea    Past Surgical History:  Procedure Laterality Date   CESAREAN SECTION N/A 07/24/2021   Procedure: CESAREAN SECTION;  Surgeon: Huel Cote, MD;  Location: [facility] LD [facility]S;  Service: Obstetrics;  Laterality: N/A;   wisdom teeth removal     Social History   Socioeconomic History   Marital status: Married    Spouse name: Not on file   Number of children: Not on file   Years of education: Not on file   Highest education level: Not on file  Occupational History   Not on file  Tobacco Use   Smoking status: Never   Smokeless tobacco: Never  Vaping Use   Vaping status: Never Used  Substance and Sexual Activity   Alcohol use: No   Drug use: No   Sexual activity: Not Currently    Birth control/protection: None  Other Topics Concern   Not on file  Social History Narrative   Not on file   Social Drivers of Health   Financial Resource Strain: Not on file  Food Insecurity: Not on file  Transportation Needs: Not on file  Physical Activity: Not on file  Stress: Not on file  Social Connections: Not on file  Intimate Partner Violence: Not on file   No current facility-administered medications on file prior to encounter.   Current Outpatient Medications on File Prior to Encounter  Medication Sig Dispense Refill   ibuprofen (ADVIL)  200 MG tablet Take 3 tablets (600 mg total) by mouth every 6 (six) hours as needed.     No Known Allergies  I have reviewed patient's Past Medical Hx, Surgical Hx, Family Hx, Social Hx, medications and allergies.   ROS:  Review of Systems Review of Systems  Other systems negative   Physical Exam  Physical Exam Patient Vitals for the past 24 hrs:  BP Temp Temp src Pulse Resp SpO2 Height Weight  01/23/23 1120 118/70 98.2 F (36.8 C) Oral 82 16 100 % -- --  01/23/23 1115 -- -- -- -- -- -- 5\' 3"  (1.6 m) 48.5 kg   Constitutional: Well-developed, well-nourished female in no acute distress.  Cardiovascular: normal rate Respiratory: normal effort GI: Abd soft, non-tender. Pos BS x 4 MS: Extremities nontender, no edema, normal ROM Neurologic: Alert and oriented x 4.  GU: Neg CVAT.  PELVIC EXAM: Cervix pink, visually closed, with small exophytic friable polyp, scant dark brown/red discharge, vaginal walls and external genitalia normal  FHT 153 by doppler  LAB RESULTS Results for orders placed or performed during the hospital encounter of 01/23/23 (from the past 24 hours)  Wet prep, genital     Status: Abnormal   Collection Time: 01/23/23 12:05 PM   Specimen: Vaginal  Result Value Ref Range   Yeast Wet Prep HPF POC NONE SEEN NONE SEEN   Trich, Wet  Prep NONE SEEN NONE SEEN   Clue Cells Wet Prep HPF POC NONE SEEN NONE SEEN   WBC, Wet Prep HPF POC >=10 (A) <10   Sperm NONE SEEN   Urinalysis, Routine w reflex microscopic -Urine, Clean Catch     Status: Abnormal   Collection Time: 01/23/23 12:17 PM  Result Value Ref Range   Color, Urine YELLOW YELLOW   APPearance HAZY (A) CLEAR   Specific Gravity, Urine 1.017 1.005 - 1.030   pH 5.0 5.0 - 8.0   Glucose, UA NEGATIVE NEGATIVE mg/dL   Hgb urine dipstick MODERATE (A) NEGATIVE   Bilirubin Urine NEGATIVE NEGATIVE   Ketones, ur NEGATIVE NEGATIVE mg/dL   Protein, ur NEGATIVE NEGATIVE mg/dL   Nitrite NEGATIVE NEGATIVE   Leukocytes,Ua  NEGATIVE NEGATIVE   RBC / HPF 0-5 0 - 5 RBC/hpf   WBC, UA 0-5 0 - 5 WBC/hpf   Bacteria, UA NONE SEEN NONE SEEN   Squamous Epithelial / HPF 0-5 0 - 5 /HPF   Mucus PRESENT        IMAGING No results found.    MAU Management/MDM: I have reviewed the triage vital signs and the nursing notes.   Pertinent labs & imaging results that were available during my care of the patient were reviewed by me and considered in my medical decision making (see chart for details).      I have reviewed her medical records including past results, notes and treatments. Medical, Surgical, and family history were reviewed.  Medications and recent lab tests were reviewed  TVUS performed to evaluate known [facility] Vaginal swab and cultures done Will check baseline Ultrasound to rule out ectopic.  Treatments in MAU included FHT.   This bleeding/pain can represent a normal pregnancy with bleeding, spontaneous abortion or even an ectopic which can be life-threatening.  The process as listed above helps to determine which of these is present.   ASSESSMENT 1. [redacted] weeks gestation of pregnancy   2. Vaginal bleeding   3. Polyp at cervical os     PLAN Discharge home Follow up GC/Chlamydia results Continue routine prenatal care   Follow-up Information     Edwinna Areola, DO Follow up.   Specialty: Obstetrics and Gynecology Contact information: [address] [place] [address] [phone]               Pt stable at time of discharge. Encouraged to return here if she develops worsening of symptoms, increase in pain, fever, or other concerning symptoms.   Wyn Forster, MD FMOB Fellow, Faculty practice [facility]  01/23/2023  1:15 PM

## 2023-03-28 ENCOUNTER — Inpatient Hospital Stay (HOSPITAL_COMMUNITY)
Admission: AD | Admit: 2023-03-28 | Discharge: 2023-03-28 | Disposition: A | Discharge status: Home / Self Care | Attending: Obstetrics and Gynecology | Admitting: Obstetrics and Gynecology

## 2023-03-28 ENCOUNTER — Encounter (HOSPITAL_COMMUNITY): Payer: Self-pay | Admitting: Obstetrics and Gynecology

## 2023-03-28 ENCOUNTER — Other Ambulatory Visit: Payer: Self-pay

## 2023-03-28 DIAGNOSIS — O26899 Other specified pregnancy related conditions, unspecified trimester: Secondary | ICD-10-CM

## 2023-03-28 DIAGNOSIS — R1030 Lower abdominal pain, unspecified: Secondary | ICD-10-CM | POA: Insufficient documentation

## 2023-03-28 DIAGNOSIS — Z3A23 23 weeks gestation of pregnancy: Secondary | ICD-10-CM | POA: Diagnosis not present

## 2023-03-28 DIAGNOSIS — O26892 Other specified pregnancy related conditions, second trimester: Secondary | ICD-10-CM | POA: Insufficient documentation

## 2023-03-28 DIAGNOSIS — O368120 Decreased fetal movements, second trimester, not applicable or unspecified: Secondary | ICD-10-CM | POA: Insufficient documentation

## 2023-03-28 DIAGNOSIS — R519 Headache, unspecified: Secondary | ICD-10-CM | POA: Insufficient documentation

## 2023-03-28 DIAGNOSIS — R109 Unspecified abdominal pain: Secondary | ICD-10-CM | POA: Diagnosis not present

## 2023-03-28 LAB — URINALYSIS, ROUTINE W REFLEX MICROSCOPIC
Bilirubin Urine: NEGATIVE
Glucose, UA: NEGATIVE mg/dL
Hgb urine dipstick: NEGATIVE
Ketones, ur: NEGATIVE mg/dL
Nitrite: NEGATIVE
Protein, ur: NEGATIVE mg/dL
Specific Gravity, Urine: 1.013 (ref 1.005–1.030)
pH: 6 (ref 5.0–8.0)

## 2023-03-28 LAB — WET PREP, GENITAL
Clue Cells Wet Prep HPF POC: NONE SEEN
Sperm: NONE SEEN
Trich, Wet Prep: NONE SEEN
WBC, Wet Prep HPF POC: >=10 — AB (ref <10)
Yeast Wet Prep HPF POC: NONE SEEN

## 2023-03-28 NOTE — Discharge Instructions (Signed)
 We highly recommend childbirth education to help you plan for labor and begin practicing coping skills (which will be needed with or without pain meds).  [place] Childbirth Education Options: Sign up by visiting [email or web address]  Childbirth ~ Self-Paced eClass (English and Spanish) This online class offers you the freedom to complete a childbirth education series in the comfort of your own home at your own pace.  Childbirth Class (In-Person 4-Week Series  or on Saturdays, Virtual 4-Week Series ~ [place]) This interactive in-person class series will help you and your partner prepare for your birth experience. Topics include: Labor & Birth, Comfort Measures, Breathing Techniques, Massage, Medical Interventions, Pain Management Options, Cesarean Birth, Postpartum Care, and Newborn Care  Comfort Techniques for Labor ~ In-Person Class [facility]) This interactive class is designed for parents-to-be who want to learn & practice hands-on skills to help relieve some of the discomfort of labor and encourage their babies to rotate toward the best position for birth. Moms and their partners will be able to try a variety of labor positions with birth balls and rebozos as well as practice breathing, relaxation, and visualization techniques.  Natural Childbirth Class (In-Person 5-Week Series, In-Person on Saturdays or Virtual 5-Week Series ~ [place]) This class series is designed for expectant parents who want to learn and practice natural methods of coping with the process of labor and childbirth.  Cesarean Birth Self-Paced eClass (English and Spanish) This online course provides comprehensive information you can trust as you prepare for a possible cesarean birth. In this class, you'll learn how to make your birth and recovery comfortable and joyful through instructive video clips, animations, and activities.  Waterbirth ~ Airline pilot Interested in a waterbirth? In addition to a consultation  with your credentialed waterbirth provider, this free, informational online class will help you discover whether waterbirth is the right fit for you. Not all obstetrical practices offer waterbirth, so check with your healthcare provider.  Tour Probation officer) - [facility] [employer] our 4 minute video tour of [facility] located in [place].   [place] Parenting Education Options:  Pregnancy 101 (Virtual) Congratulations on your pregnancy! This class is geared toward moms in their first trimester, but everyone is welcome. We are excited to guide you through all aspects of supporting a healthy pregnancy. You will learn what to expect at routine prenatal care appointments, common postpartum adjustments, basic infant safety, and breastfeeding.  Successful Partnering & Parenting ~ In-Person Workshop [facility]) This workshop inspires and equips partners of all economic levels, ages, and cultures to confidently care for their infants, support the birthing persons, and navigate their own transformations into new partners and parents. Learning activities are geared towards supporting partner, but moms are welcome to attend.  'Baby & Me' Parenting Group (Virtual on Wednesdays at 11am) Enjoy this time discussing newborn & infant parenting topics and family adjustment issues with other new parents in a relaxed environment. Each week brings a new speaker or baby-centered activity. This group offers support and connection to parents as they journey through the adjustments and struggles of that sometimes overwhelming first year after the birth of a child.  Baby Safety, CPR, & Choking Class ~ Virtual This life-saving information is meant to encourage parents as they learn important safety and prevention tips as well as infant CPR and relief of choking.  Breastfeeding Class (In-Person in [place] or [employer]) Families learn what to expect in the  first days and weeks of breastfeeding your  newborn. IF YOU ARE AN EMPLOYEE TAKING THIS CLASS FOR CREDIT, DO NOT register yourself. Please e-mail [email or web address].   Breastfeeding Self-Paced eClass (English & Spanish) Families learn what to expect in the first days and weeks of breastfeeding your newborn.  Caring for Baby ~ In-Person, Virtual or Self-Paced Class This in-person class is for both expectant and adoptive parents who want to learn and practice the most up-to-date newborn care for their babies. Focus is on birth through the first six weeks of life.  CPR & Choking Relief for Infants & Children ~ In-Person Class [facility]) This in-person course is designed for any parent, expectant parent, or adult who cares for infants or children. Participants learn and demonstrate cardiopulmonary resuscitation and choking relief procedures for both infants and children.  Grandparent Love ~ In-Person Class Grandparents will learn the most updated infant care and safety recommendations. They will discover ways to support their own children during the transition into the parenting role and receive tips on communicating with the new parents.  [place] Parenting Support Group Options:  Bereavement Grief Support Group (Pregnancy/Infant Loss) - Virtual This is an ongoing experience that meets once a month and is designed to help you honor the past, assist you in discovering tools to strengthen you today, and aid you in developing hope for the future.  Breastfeeding & Pumping Support Group (In-Person on Thursdays at 12pm or Virtual on Tuesdays at 5pm) Join [place] in-person each Thursday starting June 1st, 2023 at 12pm! This support group is free for all families looking for breastfeeding and/or pumping support.   Community-Based Childbirth Education Options:  [facility] Department Classes:  Childbirth education classes can help you get ready for a positive parenting experience. You  can also meet other expectant parents and get free stuff for your baby. Each class runs for five weeks on the same night and costs $45 for the mother-to-be and her support person. Medicaid covers the cost if you are eligible. Call [phone] to register.  [facility] offers a variety of programs for the [employer] and is another great way to get connected. Please go to [email or web address] for more information.  Childbirth With A Twist! Be informed of your options, get educated on birth, understand what your body is doing, learn how to cope, and have a lot of fun and laughs all while doing it either from the comfort of your couch OR in our cozy office and classroom space near the [place] airport. If you are taking a virtual class, then class is taught LIVE, so you can ask questions and receive answers in real-time from an experienced doula and childbirth educator.  This virtual childbirth education class will meet for five instruction times online.  Although we are based in [place], [place], this virtual class is open to anyone in the world. Please visit: [email or web address] for more information.  Books We Love: The Doula Guide to Childbirth by Harland German and Otila Back The First-Time Parent's Childbirth Handbook by Dr. Amie Critchley, CNM The Birth Partner by Truddie Crumble    Commonly Asked Questions During Pregnancy  How Will I Feel When I'm Pregnant? Pregnancy symptoms in the first trimester of pregnancy may not appear until the middle or end of the second month. Hormonal changes will cause tenderness in your breasts, and you may begin to feel more tired than usual. Food cravings, an increase in the need to urinate, and morning sickness may all be more noticeable.  Pregnancy symptoms in the second  trimester are more prominent. You may start to feel the baby move and become more active. Dental issues, nasal/sinus problems,  and skin irritations can begin to appear. Heartburn, leg cramps, dizziness, and a vaginal discharge are also common. Every woman is different when it comes to the symptoms they experience, and some may not experience any at all. Pregnancy symptoms in the third trimester can include increased frequency in urination, leg cramps, constipation, ligament pain in the abdomen, and weight gain. Back pain and Braxton Hicks contractions will become increasingly more common.  Why is nutrition during pregnancy important? Eating well is one of the best things you can do during pregnancy. Good nutrition helps you handle the extra demands on your body as your pregnancy progresses. The goal is to balance getting enough nutrients to support the growth of your fetus and maintaining a healthy weight.  How much water should I drink during pregnancy? During pregnancy you should drink 8 to 12 cups (64 to 96 ounces) of water every day. Water has many benefits. It aids digestion and helps form the amniotic fluid around the fetus. Water also helps nutrients circulate in the body and helps waste leave the body.  What can I do to help with nausea? Eat dry toast or crackers in the morning before you get out of bed to avoid moving around on an empty stomach. Eat five or six "mini meals" a day to ensure that your stomach is never empty. Eat frequent bites of foods like nuts, fruits, or crackers.  What can help with constipation during pregnancy? Constipation is common near the end of pregnancy. Eating more foods with fiber can help fight constipation. Fiber is found in fruits, vegetables, whole grains, beans, nuts, and seeds. You should aim for about 25 grams of fiber in your diet each day. Drink a lot of water as you increase your fiber intake.  How much coffee can I drink while I'm pregnant? Research suggests that moderate caffeine consumption (less than 200 milligrams per day) does not cause miscarriage or preterm birth.  That's the amount in one 12-ounce cup of coffee. Remember that caffeine also is found in tea, chocolate, energy drinks, and soft drinks. Caffeine can interfere with sleep and contribute to nausea and light-headedness. Caffeine also can increase urination and lead to dehydration.  What can I do to prevent or ease back pain during pregnancy? There are several things you can do to prevent or ease back pain. For example, wear supportive clothing and shoes. Pay attention to your position when sitting, sleeping, and lifting things. If you need to stand for a long time, rest one foot on a stool or a box to take the strain off your back. You also can use heat or cold to soothe sore muscles.  Is it safe to exercise during pregnancy? If you are healthy and your pregnancy is normal, it is safe to continue or start regular physical activity. Physical activity does not increase your risk of miscarriage, low birth weight, or early delivery. It's still important to discuss exercise with your ob-gyn provider during your early prenatal visits.   What are the benefits of exercise during pregnancy? Regular exercise during pregnancy benefits you and your fetus in these key ways: Reduces back pain Eases constipation May decrease your risk of gestational diabetes, preeclampsia, and cesarean birth Promotes healthy weight gain during pregnancy Improves your overall fitness and strengthens your heart and blood vessels Helps you to lose the baby weight after your baby is  born  Is it safe to dye my hair during pregnancy? Yes, it's safe. Only a small amount of chemicals from hair dye is absorbed through the scalp.  Is it safe to keep a cat during pregnancy? Yes, you can keep your cat. You may have heard that cat feces can carry the infection toxoplasmosis. This infection is only found in cats who go outdoors and hunt prey, such as mice and other rodents. If you do have a cat who goes outdoors or eats prey, have someone  else take over daily cleaning the litter box. This will keep you away from any cat feces. If you have an indoor cat who only eats cat food and doesn't have contact with outside animals, your risk of toxoplasmosis is very low.  What substances should I avoid during pregnancy? During pregnancy, women should not use tobacco, alcohol, marijuana, illegal drugs, or prescription medications for nonmedical reasons. Avoiding these substances and getting regular prenatal care are important to having a healthy pregnancy and a healthy baby.   What foods to I need to avoid in pregnancy? To help prevent listeriosis, avoid eating the following foods while you are pregnant: Unpasteurized milk and foods made with unpasteurized milk, including soft cheeses Hot dogs and luncheon meats, unless they are heated until steaming hot just before serving Unwashed raw produce such as fruits and vegetables  Avoid all raw and undercooked seafood, eggs, meat, and poultry while you are pregnant. Do not eat sushi made with raw fish (cooked sushi is safe). Cooking and pasteurization are the only ways to kill Listeria.  Limit your exposure to mercury by not eating bigeye tuna, king mackerel, marlin, orange roughy, shark, swordfish, or tilefish. Limit eating white (albacore) tuna to 6 ounces a week. You do not have to avoid all fish during pregnancy. In fact, fish and shellfish are nutritious foods with vital nutrients for a pregnant woman and her fetus. Be sure to eat at least 8-12 ounces of low-mercury fish and shellfish per week.  Is travel safe to during pregnancy? In most cases, pregnant women can travel safely until close to their due dates. But travel may not be recommended for women who have pregnancy complications. If you are planning a trip, talk with your (ob-gyn) provider. And no matter how you choose to travel, think ahead about your comfort and safety.  Can I use a sauna or hot tub early in pregnancy? It's best not to.  Your core body temperature rises when you use saunas and hot tubs. This rise in temperature can be harmful for your fetus.  Can I get a massage while pregnant? Yes. Massage is a good way to relax and improve circulation. The best position for a massage while you're pregnant is lying on your side, rather than facedown. Some massage tables have a cut-out for the belly, allowing you to lie facedown comfortably. Tell your massage therapist that you're pregnant if you're not showing yet. Many health spas offer special prenatal massages done by therapists who are trained to work on pregnant women.  Is Having Dental Work While Pregnant Safe? Pregnancy and dental work questions are common for expecting moms. Preventive dental cleanings and annual exams during pregnancy are not only safe but are recommended. The rise in hormone levels during pregnancy causes the gums to swell, bleed, and trap food causing increased irritation to your gums. Preventive dental work while pregnant is essential to avoid oral infections such as gum disease, which has been linked to preterm birth.  The American Dental Association (ADA) recommends pregnant women eat a balanced diet, brush their teeth thoroughly with ADA-approved fluoride toothpaste twice a day, and floss daily. Have preventive exams and cleanings during your pregnancy. Let your dentist know you are pregnant. Postpone non-emergency dental work until the second trimester or after delivery, if possible. Elective procedures should be postponed until after the delivery.   Safe Medications in Pregnancy   Acne:  Benzoyl Peroxide  Salicylic Acid   Backache/Headache:  Tylenol: 2 regular strength every 4 hours OR               2 Extra strength every 6 hours   Colds/Coughs/Allergies:  Benadryl (alcohol free) 25 mg every 6 hours as needed  Breath right strips  Claritin  Cepacol throat lozenges  Chloraseptic throat spray  Cold-Eeze- up to three times per day  Cough  drops, alcohol free  Flonase (by prescription only)  Guaifenesin  Mucinex  Robitussin DM (plain only, alcohol free)  Saline nasal spray/drops  Sudafed (pseudoephedrine) & Actifed * use only after [redacted] weeks gestation and if you do not have high blood pressure  Tylenol  Vicks Vaporub  Zinc lozenges  Zyrtec   Constipation:  Colace  Ducolax suppositories  Fleet enema  Glycerin suppositories  Metamucil  Milk of magnesia  Miralax  Senokot  Smooth move tea   Diarrhea:  Kaopectate  Imodium A-D   *NO pepto Bismol   Hemorrhoids:  Anusol  Anusol HC  Preparation H  Tucks   Indigestion:  Tums  Maalox  Mylanta  Zantac  Pepcid   Insomnia:  Benadryl (alcohol free) 25mg  every 6 hours as needed  Tylenol PM  Unisom, no Gelcaps   Leg Cramps:  Tums  MagGel   Nausea/Vomiting:  Bonine  Dramamine  Emetrol  Ginger extract  Sea bands  Meclizine  Nausea medication to take during pregnancy:  Unisom (doxylamine succinate 25 mg tablets) Take one tablet daily at bedtime. If symptoms are not adequately controlled, the dose can be increased to a maximum recommended dose of two tablets daily (1/2 tablet in the morning, 1/2 tablet mid-afternoon and one at bedtime).  Vitamin B6 100mg  tablets. Take one tablet twice a day (up to 200 mg per day).   Skin Rashes:  Aveeno products  Benadryl cream or 25mg  every 6 hours as needed  Calamine Lotion  1% cortisone cream   Yeast infection:  Gyne-lotrimin 7  Monistat 7    **If taking multiple medications, please check labels to avoid duplicating the same active ingredients  **take medication as directed on the label  ** Do not exceed 4000 mg of tylenol in 24 hours  **Do not take medications that contain aspirin or ibuprofen

## 2023-03-28 NOTE — MAU Provider Note (Signed)
 Chief Complaint:  Abdominal Pain   HPI   Event Date/Time   First Provider Initiated Contact with Patient 03/28/23 1853      Karen Yang is a 28 y.o. G2P0101 at [redacted]w[redacted]d who presents to maternity admissions reporting a HA that occurred on 03/25/23 which she describes as intermittent and she denies taking any medication for it and some occasional lower abdominal cramping which has since resolved.  She is also concerned about some DFM but acknowledges FM's at this time. Denies any VB, LOF, CTX  Pregnancy Course: [place]   Past Medical History:  Diagnosis Date   Anemia    Asthma    Stress induced   Blood transfusion without reported diagnosis    as an infant   Dysmenorrhea    OB History  Gravida Para Term Preterm AB Living  2 1 0 1 0 1  SAB IAB Ectopic Multiple Live Births  0 0 0 0 1    # Outcome Date GA Lbr Len/2nd Weight Sex Type Anes PTL Lv  2 Current           1 Preterm 07/24/21 [redacted]w[redacted]d  2930 g M CS-LTranv Spinal  LIV   Past Surgical History:  Procedure Laterality Date   CESAREAN SECTION N/A 07/24/2021   Procedure: CESAREAN SECTION;  Surgeon: Huel Cote, MD;  Location: [facility] LD ORS;  Service: Obstetrics;  Laterality: N/A;   wisdom teeth removal     Family History  Problem Relation Age of Onset   Thyroid disease Mother    Ovarian cysts Sister        half sister, aged 59 when cyst was removed   Social History   Tobacco Use   Smoking status: Never   Smokeless tobacco: Never  Vaping Use   Vaping status: Never Used  Substance Use Topics   Alcohol use: No   Drug use: No   No Known Allergies No medications prior to admission.    I have reviewed patient's Past Medical Hx, Surgical Hx, Family Hx, Social Hx, medications and allergies.   ROS  Pertinent items noted in HPI and remainder of comprehensive ROS otherwise negative.   PHYSICAL EXAM  Patient Vitals for the past 24 hrs:  BP Temp Temp src Pulse Resp SpO2  03/28/23 1845 119/61 -- -- 86 -- 99 %  03/28/23 1822  (!) 119/59 98.2 F (36.8 C) Oral 86 16 99 %    Constitutional: Well-developed, well-nourished female in no acute distress.  Cardiovascular: normal rate & rhythm, warm and well-perfused Respiratory: normal effort, no problems with respiration noted GI: Abd soft, non-tender, gravid MS: Extremities nontender, no edema, normal ROM Neurologic: Alert and oriented x 4.  GU: no CVA tenderness Pelvic: Blind swabs to be sent      Fetal Tracing: @ 1932 Cat 1 reactive for GA of [redacted]w[redacted]d Baseline: 140 Variability: moderate  Accelerations: present  Decelerations: absent Toco: irritable    Labs: Results for orders placed or performed during the hospital encounter of 03/28/23 (from the past 24 hours)  Urinalysis, Routine w reflex microscopic -Urine, Clean Catch     Status: Abnormal   Collection Time: 03/28/23  6:44 PM  Result Value Ref Range   Color, Urine YELLOW YELLOW   APPearance CLEAR CLEAR   Specific Gravity, Urine 1.013 1.005 - 1.030   pH 6.0 5.0 - 8.0   Glucose, UA NEGATIVE NEGATIVE mg/dL   Hgb urine dipstick NEGATIVE NEGATIVE   Bilirubin Urine NEGATIVE NEGATIVE   Ketones, ur NEGATIVE NEGATIVE mg/dL  Protein, ur NEGATIVE NEGATIVE mg/dL   Nitrite NEGATIVE NEGATIVE   Leukocytes,Ua TRACE (A) NEGATIVE   RBC / HPF 0-5 0 - 5 RBC/hpf   WBC, UA 0-5 0 - 5 WBC/hpf   Bacteria, UA RARE (A) NONE SEEN   Squamous Epithelial / HPF 0-5 0 - 5 /HPF   Mucus PRESENT   Wet prep, genital     Status: Abnormal   Collection Time: 03/28/23  7:25 PM   Specimen: Vaginal  Result Value Ref Range   Yeast Wet Prep HPF POC NONE SEEN NONE SEEN   Trich, Wet Prep NONE SEEN NONE SEEN   Clue Cells Wet Prep HPF POC NONE SEEN NONE SEEN   WBC, Wet Prep HPF POC >=10 (A) <10   Sperm NONE SEEN     Imaging:  No results found.  MDM & MAU COURSE  MDM:  HIGH  HA and mild abdominal cramping which I offered some medication for but patient declines reporting they have resolved  NST: Cat 1 reactive for GA  UA:  likely contaminated will send for CX  CX's to be obtained Wet prep negative  PO Hydration   MAU Course: Orders Placed This Encounter  Procedures   Wet prep, genital   Culture, OB Urine   Urinalysis, Routine w reflex microscopic -Urine, Clean Catch   Discharge patient Discharge disposition: 01-Home or Self Care; Discharge patient date: 03/28/2023   No orders of the defined types were placed in this encounter.   ASSESSMENT   1. Decreased fetal movements in second trimester, single or unspecified fetus   2. Cramping affecting pregnancy, antepartum   3. Nonintractable headache, unspecified chronicity pattern, unspecified headache type     PLAN  Discharge home in stable condition with return precautions.  F/U as scheduled with your OB Provider See AVS for detailed verbal and written  education and instructions provided to the patient     Allergies as of 03/28/2023   No Known Allergies      Medication List    You have not been prescribed any medications.     Marcell Barlow, MSN, [facility], Center for [employer]

## 2023-03-28 NOTE — MAU Note (Signed)
.  Karen Yang is a 28 y.o. at [redacted]w[redacted]d here in MAU reporting: lower abd cramping since waking up this morning.  Pt states cramping was worse earlier.  Pt also reports DFM for the past few days.  FHR 145 in triage.  Pt also reports headache, and hot flashes over the past few days, both of which have resolved before coming to mau.  Denies LOF or vag bleeding.   Pain score: 2 Vitals:   03/28/23 1822  BP: (!) 119/59  Pulse: 86  Resp: 16  Temp: 98.2 F (36.8 C)  SpO2: 99%     FHT: 145 Lab orders placed from triage: ua

## 2023-03-29 LAB — GC/CHLAMYDIA PROBE AMP (~~LOC~~) NOT AT ARMC
Chlamydia: NEGATIVE
Comment: NEGATIVE
Comment: NORMAL
Neisseria Gonorrhea: NEGATIVE

## 2023-03-29 LAB — CULTURE, OB URINE: Culture: NO GROWTH

## 2023-04-07 ENCOUNTER — Inpatient Hospital Stay (HOSPITAL_COMMUNITY)
Admission: AD | Admit: 2023-04-07 | Discharge: 2023-04-08 | Disposition: A | Discharge status: Home / Self Care | Payer: Self-pay | Attending: Student | Admitting: Student

## 2023-04-07 ENCOUNTER — Inpatient Hospital Stay (HOSPITAL_COMMUNITY)

## 2023-04-07 ENCOUNTER — Encounter (HOSPITAL_COMMUNITY): Payer: Self-pay | Admitting: Student

## 2023-04-07 DIAGNOSIS — R197 Diarrhea, unspecified: Secondary | ICD-10-CM | POA: Diagnosis not present

## 2023-04-07 DIAGNOSIS — Z3A24 24 weeks gestation of pregnancy: Secondary | ICD-10-CM | POA: Insufficient documentation

## 2023-04-07 DIAGNOSIS — O4702 False labor before 37 completed weeks of gestation, second trimester: Secondary | ICD-10-CM | POA: Insufficient documentation

## 2023-04-07 DIAGNOSIS — O99891 Other specified diseases and conditions complicating pregnancy: Secondary | ICD-10-CM | POA: Insufficient documentation

## 2023-04-07 DIAGNOSIS — O26892 Other specified pregnancy related conditions, second trimester: Secondary | ICD-10-CM | POA: Insufficient documentation

## 2023-04-07 DIAGNOSIS — R1084 Generalized abdominal pain: Secondary | ICD-10-CM | POA: Diagnosis not present

## 2023-04-07 DIAGNOSIS — R1011 Right upper quadrant pain: Secondary | ICD-10-CM | POA: Diagnosis present

## 2023-04-07 LAB — COMPREHENSIVE METABOLIC PANEL
ALT: 11 U/L (ref 0–44)
AST: 20 U/L (ref 15–41)
Albumin: 2.9 g/dL — ABNORMAL LOW (ref 3.5–5.0)
Alkaline Phosphatase: 58 U/L (ref 38–126)
Anion gap: 10 (ref 5–15)
BUN: <5 mg/dL — ABNORMAL LOW (ref 6–20)
CO2: 21 mmol/L — ABNORMAL LOW (ref 22–32)
Calcium: 8.6 mg/dL — ABNORMAL LOW (ref 8.9–10.3)
Chloride: 103 mmol/L (ref 98–111)
Creatinine, Ser: 0.45 mg/dL (ref 0.44–1.00)
GFR, Estimated: >60 mL/min (ref >60)
Glucose, Bld: 91 mg/dL (ref 70–99)
Potassium: 3.6 mmol/L (ref 3.5–5.1)
Sodium: 134 mmol/L — ABNORMAL LOW (ref 135–145)
Total Bilirubin: 0.4 mg/dL (ref 0.0–1.2)
Total Protein: 7.1 g/dL (ref 6.5–8.1)

## 2023-04-07 LAB — URINALYSIS, ROUTINE W REFLEX MICROSCOPIC
Bilirubin Urine: NEGATIVE
Glucose, UA: NEGATIVE mg/dL
Hgb urine dipstick: NEGATIVE
Ketones, ur: NEGATIVE mg/dL
Nitrite: NEGATIVE
Protein, ur: NEGATIVE mg/dL
Specific Gravity, Urine: 1.005 (ref 1.005–1.030)
pH: 6 (ref 5.0–8.0)

## 2023-04-07 LAB — CBC
HCT: 30 % — ABNORMAL LOW (ref 36.0–46.0)
Hemoglobin: 9.5 g/dL — ABNORMAL LOW (ref 12.0–15.0)
MCH: 24.8 pg — ABNORMAL LOW (ref 26.0–34.0)
MCHC: 31.7 g/dL (ref 30.0–36.0)
MCV: 78.3 fL — ABNORMAL LOW (ref 80.0–100.0)
Platelets: 282 10*3/uL (ref 150–400)
RBC: 3.83 MIL/uL — ABNORMAL LOW (ref 3.87–5.11)
RDW: 14.2 % (ref 11.5–15.5)
WBC: 11.4 10*3/uL — ABNORMAL HIGH (ref 4.0–10.5)
nRBC: 0 % (ref 0.0–0.2)

## 2023-04-07 LAB — LIPASE, BLOOD: Lipase: 28 U/L (ref 11–51)

## 2023-04-07 LAB — FETAL FIBRONECTIN: Fetal Fibronectin: NEGATIVE

## 2023-04-07 MED ORDER — NIFEDIPINE 10 MG PO CAPS
10.0000 mg | ORAL_CAPSULE | ORAL | Status: DC | PRN
  Administered 2023-04-07 (×3): 10 mg via ORAL
  Filled 2023-04-07 (×3): qty 1

## 2023-04-07 MED ORDER — ONDANSETRON 4 MG PO TBDP
4.0000 mg | ORAL_TABLET | Freq: Once | ORAL | Status: DC
Start: 2023-04-07 — End: 2023-04-08

## 2023-04-07 NOTE — MAU Provider Note (Signed)
 Chief Complaint:  No chief complaint on file.   Event Date/Time   First Provider Initiated Contact with Patient 04/07/23 2040     HPI: Karen Yang is a 28 y.o. G2P0101 at 74w4dwho presents to maternity admissions reporting intermittent pain in upper abdomen associated with acid reflux. Has had diarrhea 3 times today but admits to having intermittent diarrhea the entire pregnancy. Also feels some intermittent lower abdominal cramping, ? Contractions. . She reports good fetal movement, denies LOF, vaginal bleeding, urinary symptoms, or fever/chills.   Abdominal Pain This is a new problem. The current episode started today. The problem occurs intermittently. The pain is located in the generalized abdominal region. The quality of the pain is cramping and colicky. The abdominal pain does not radiate. Associated symptoms include diarrhea and nausea. Pertinent negatives include no constipation, dysuria, fever or frequency. Nothing aggravates the pain. The pain is relieved by Nothing. Treatments tried: Rx med for acid reflux.   RN note: Karen Yang is a 28 y.o. at [redacted]w[redacted]d here in MAU reporting:  Woke up with heart burn took prescription and it eased that pain, was burping and it had a bile taste, made her nauseous. Since 10 am she had diarrhea 3 times.  Denies vomiting.  Reports sharp upper abdominal pain that has been off and on all day.  Reports occasional contractions and lower back pain that began in the last hour. Denies vaginal bleeding or leaking of fluid. +FM  Pain score: 4/10  Past Medical History: Past Medical History:  Diagnosis Date   Anemia    Asthma    Stress induced   Blood transfusion without reported diagnosis    as an infant   Dysmenorrhea     Past obstetric history: OB History  Gravida Para Term Preterm AB Living  2 1 0 1 0 1  SAB IAB Ectopic Multiple Live Births  0 0 0 0 1    # Outcome Date GA Lbr Len/2nd Weight Sex Type Anes PTL Lv  2 Current           1  Preterm 07/24/21 [redacted]w[redacted]d  2930 g M CS-LTranv Spinal  LIV    Past Surgical History: Past Surgical History:  Procedure Laterality Date   CESAREAN SECTION N/A 07/24/2021   Procedure: CESAREAN SECTION;  Surgeon: Huel Cote, MD;  Location: [facility];  Service: Obstetrics;  Laterality: N/A;   wisdom teeth removal      Family History: Family History  Problem Relation Age of Onset   Thyroid disease Mother    Ovarian cysts Sister        half sister, aged 64 when cyst was removed    Social History: Social History   Tobacco Use   Smoking status: Never   Smokeless tobacco: Never  Vaping Use   Vaping status: Never Used  Substance Use Topics   Alcohol use: No   Drug use: No    Allergies: No Known Allergies  Meds:  No medications prior to admission.    I have reviewed patient's Past Medical Hx, Surgical Hx, Family Hx, Social Hx, medications and allergies.   ROS:  Review of Systems  Constitutional:  Negative for fever.  Gastrointestinal:  Positive for abdominal pain, diarrhea and nausea. Negative for constipation.  Genitourinary:  Negative for dysuria and frequency.   Other systems negative  Physical Exam  Patient Vitals for the past 24 hrs:  BP Pulse Resp SpO2 Height Weight  04/07/23 2029 (!) 118/55 91 16 100 % 5\' 3"  (  1.6 m) 54.8 kg   Constitutional: Well-developed, well-nourished female in no acute distress.  Cardiovascular: normal rate and rhythm Respiratory: normal effort GI: Abd soft, non-tender, gravid appropriate for gestational age.   No rebound or guarding. MS: Extremities nontender, no edema, normal ROM Neurologic: Alert and oriented x 4.  GU: Neg CVAT.  PELVIC EXAM:  Dilation: Closed Effacement (%): Thick Exam by:: Artelia Laroche CNM Fetal fibronectin sent   FHT:  Baseline 145 , moderate variability, accelerations present, no decelerations Contractions: q 3-5 mins Irregular    Labs: Results for orders placed or performed during the hospital encounter  of 04/07/23 (from the past 24 hours)  Urinalysis, Routine w reflex microscopic -Urine, Clean Catch     Status: Abnormal   Collection Time: 04/07/23  8:16 PM  Result Value Ref Range   Color, Urine STRAW (A) YELLOW   APPearance CLEAR CLEAR   Specific Gravity, Urine 1.005 1.005 - 1.030   pH 6.0 5.0 - 8.0   Glucose, UA NEGATIVE NEGATIVE mg/dL   Hgb urine dipstick NEGATIVE NEGATIVE   Bilirubin Urine NEGATIVE NEGATIVE   Ketones, ur NEGATIVE NEGATIVE mg/dL   Protein, ur NEGATIVE NEGATIVE mg/dL   Nitrite NEGATIVE NEGATIVE   Leukocytes,Ua TRACE (A) NEGATIVE   RBC / HPF 0-5 0 - 5 RBC/hpf   WBC, UA 0-5 0 - 5 WBC/hpf   Bacteria, UA RARE (A) NONE SEEN   Squamous Epithelial / HPF 0-5 0 - 5 /HPF   Mucus PRESENT   CBC     Status: Abnormal   Collection Time: 04/07/23  8:53 PM  Result Value Ref Range   WBC 11.4 (H) 4.0 - 10.5 K/uL   RBC 3.83 (L) 3.87 - 5.11 MIL/uL   Hemoglobin 9.5 (L) 12.0 - 15.0 g/dL   HCT 16.1 (L) 09.6 - 04.5 %   MCV 78.3 (L) 80.0 - 100.0 fL   MCH 24.8 (L) 26.0 - 34.0 pg   MCHC 31.7 30.0 - 36.0 g/dL   RDW 40.9 81.1 - 91.4 %   Platelets 282 150 - 400 K/uL   nRBC 0.0 0.0 - 0.2 %  Comprehensive metabolic panel     Status: Abnormal   Collection Time: 04/07/23  8:53 PM  Result Value Ref Range   Sodium 134 (L) 135 - 145 mmol/L   Potassium 3.6 3.5 - 5.1 mmol/L   Chloride 103 98 - 111 mmol/L   CO2 21 (L) 22 - 32 mmol/L   Glucose, Bld 91 70 - 99 mg/dL   BUN <5 (L) 6 - 20 mg/dL   Creatinine, Ser 7.82 0.44 - 1.00 mg/dL   Calcium 8.6 (L) 8.9 - 10.3 mg/dL   Total Protein 7.1 6.5 - 8.1 g/dL   Albumin 2.9 (L) 3.5 - 5.0 g/dL   AST 20 15 - 41 U/L   ALT 11 0 - 44 U/L   Alkaline Phosphatase 58 38 - 126 U/L   Total Bilirubin 0.4 0.0 - 1.2 mg/dL   GFR, Estimated >95 >62 mL/min   Anion gap 10 5 - 15  Lipase, blood     Status: None   Collection Time: 04/07/23  8:53 PM  Result Value Ref Range   Lipase 28 11 - 51 U/L  Fetal fibronectin     Status: None   Collection Time: 04/07/23  10:04 PM  Result Value Ref Range   Fetal Fibronectin NEGATIVE NEGATIVE       Imaging:  US Abdomen Limited RUQ (LIVER/GB) Result Date: 04/07/2023 CLINICAL DATA:  Upper abdominal pain EXAM: ULTRASOUND ABDOMEN LIMITED RIGHT UPPER QUADRANT COMPARISON:  CT abdomen and pelvis 12/14/2018 FINDINGS: Gallbladder: No gallstones or wall thickening visualized. No sonographic Murphy sign noted by sonographer. Common bile duct: Diameter: 2.6 mm Liver: No focal lesion identified. Within normal limits in parenchymal echogenicity. Portal vein is patent on color Doppler imaging with normal direction of blood flow towards the liver. Other: None. IMPRESSION: No cholelithiasis or sonographic evidence for acute cholecystitis. Electronically Signed   By: Annia Belt M.D.   On: 04/07/2023 21:34     MAU Course/MDM: I have reviewed the triage vital signs and the nursing notes.   Pertinent labs & imaging results that were available during my care of the patient were reviewed by me and considered in my medical decision making (see chart for details).      I have reviewed her medical records including past results, notes and treatments.   I have ordered labs and reviewed results. Normal electrolytes, no leukocytosis.  No elevations in Transaminases or Lipase. Fetal fibronectin was negative. NST reviewed   Treatments in MAU included EFM, Procardia series x 3 doses which diminished contractions Declined Zofran, states not nauseated..    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   Pt stable at time of discharge.  Wynelle Bourgeois CNM, MSN Certified  Nurse-Midwife 04/07/2023 8:40 PM

## 2023-04-07 NOTE — MAU Note (Signed)
..  Karen Yang is a 28 y.o. at [redacted]w[redacted]d here in MAU reporting:  Woke up with heart burn took prescription and it eased that pain, was burping and it had a bile taste, made her nauseous. Since 10 am she had diarrhea 3 times.  Denies vomiting.  Reports sharp upper abdominal pain that has been off and on all day.  Reports occasional contractions and lower back pain that began in the last hour. Denies vaginal bleeding or leaking of fluid. +FM  Pain score: 4/10 Vitals:   04/07/23 2029  BP: (!) 118/55  Pulse: 91  Resp: 16  SpO2: 100%     FHT:145 Lab orders placed from triage: UA

## 2023-04-08 DIAGNOSIS — O4702 False labor before 37 completed weeks of gestation, second trimester: Secondary | ICD-10-CM

## 2023-04-08 DIAGNOSIS — R197 Diarrhea, unspecified: Secondary | ICD-10-CM

## 2023-04-08 DIAGNOSIS — Z3A24 24 weeks gestation of pregnancy: Secondary | ICD-10-CM

## 2023-04-08 MED ORDER — ONDANSETRON 4 MG PO TBDP: 4.0000 mg | ORAL_TABLET | Freq: Three times a day (TID) | ORAL | 0 refills | Status: DC | PRN

## 2023-05-23 ENCOUNTER — Inpatient Hospital Stay (HOSPITAL_COMMUNITY)
Admission: AD | Admit: 2023-05-23 | Discharge: 2023-05-23 | Disposition: A | Discharge status: Home / Self Care | Attending: Obstetrics and Gynecology | Admitting: Obstetrics and Gynecology

## 2023-05-23 ENCOUNTER — Encounter (HOSPITAL_COMMUNITY): Payer: Self-pay | Admitting: Obstetrics and Gynecology

## 2023-05-23 ENCOUNTER — Other Ambulatory Visit: Payer: Self-pay

## 2023-05-23 ENCOUNTER — Inpatient Hospital Stay (HOSPITAL_BASED_OUTPATIENT_CLINIC_OR_DEPARTMENT_OTHER)

## 2023-05-23 DIAGNOSIS — O34593 Maternal care for other abnormalities of gravid uterus, third trimester: Secondary | ICD-10-CM | POA: Diagnosis not present

## 2023-05-23 DIAGNOSIS — O34219 Maternal care for unspecified type scar from previous cesarean delivery: Secondary | ICD-10-CM | POA: Insufficient documentation

## 2023-05-23 DIAGNOSIS — O368130 Decreased fetal movements, third trimester, not applicable or unspecified: Secondary | ICD-10-CM | POA: Diagnosis not present

## 2023-05-23 DIAGNOSIS — Z3689 Encounter for other specified antenatal screening: Secondary | ICD-10-CM

## 2023-05-23 DIAGNOSIS — Z3A31 31 weeks gestation of pregnancy: Secondary | ICD-10-CM | POA: Diagnosis not present

## 2023-05-23 DIAGNOSIS — Q513 Bicornate uterus: Secondary | ICD-10-CM

## 2023-05-23 DIAGNOSIS — W228XXA Striking against or struck by other objects, initial encounter: Secondary | ICD-10-CM | POA: Diagnosis not present

## 2023-05-23 DIAGNOSIS — S3991XA Unspecified injury of abdomen, initial encounter: Secondary | ICD-10-CM | POA: Diagnosis not present

## 2023-05-23 DIAGNOSIS — O9A213 Injury, poisoning and certain other consequences of external causes complicating pregnancy, third trimester: Secondary | ICD-10-CM | POA: Diagnosis not present

## 2023-05-23 DIAGNOSIS — Z3493 Encounter for supervision of normal pregnancy, unspecified, third trimester: Secondary | ICD-10-CM

## 2023-05-23 DIAGNOSIS — W19XXXA Unspecified fall, initial encounter: Secondary | ICD-10-CM | POA: Insufficient documentation

## 2023-05-23 DIAGNOSIS — O3403 Maternal care for unspecified congenital malformation of uterus, third trimester: Secondary | ICD-10-CM | POA: Diagnosis not present

## 2023-05-23 NOTE — MAU Note (Signed)
.  Karen Yang is a 28 y.o. at [redacted]w[redacted]d here in MAU reporting: fall at 1330 today into door with keys in the door scraping right side of her abdomen. Patient has felt baby move since the fall but not as much. Did not hit the ground but reports something was blocking the door behind and she did hit her abdomen directly. Has been having some intermittent cramping since. Was unable to come sooner for eval d/t child care concerns. Denies vaginal bleeding or LOF.   Onset of complaint: 05/23/23 at 1330 Pain score: 2/10 Vitals:   05/23/23 1852  BP: (!) 111/56  Pulse: 97  Resp: 18  Temp: 97.6 F (36.4 C)  SpO2: 100%     FHT: 145  Lab orders placed from triage: UA

## 2023-05-23 NOTE — MAU Provider Note (Signed)
 Chief Complaint:  Fall and Decreased Fetal Movement   HPI   None     Karen Yang is a 28 y.o. G2P0101 at [redacted]w[redacted]d who presents to maternity admissions reporting fall at approximately 1330 today, she reports she was trying to open a door and the keys hit her in the abdomen. She reports direct abdominal trauma. She has been having cramping since that time. Denies LOF or VB.   Reports DFM, not like normal pattern or frequency.   Pregnancy Course: Hx of CS   Past Medical History:  Diagnosis Date   Anemia    Asthma    Stress induced   Blood transfusion without reported diagnosis    as an infant   Dysmenorrhea    OB History  Gravida Para Term Preterm AB Living  2 1 0 1 0 1  SAB IAB Ectopic Multiple Live Births  0 0 0 0 1    # Outcome Date GA Lbr Len/2nd Weight Sex Type Anes PTL Lv  2 Current           1 Preterm 07/24/21 [redacted]w[redacted]d  2930 g M CS-LTranv Spinal  LIV   Past Surgical History:  Procedure Laterality Date   CESAREAN SECTION N/A 07/24/2021   Procedure: CESAREAN SECTION;  Surgeon: Rogene Claude, MD;  Location: [facility];  Service: Obstetrics;  Laterality: N/A;   wisdom teeth removal     Family History  Problem Relation Age of Onset   Thyroid disease Mother    Ovarian cysts Sister        half sister, aged 80 when cyst was removed   Social History   Tobacco Use   Smoking status: Never   Smokeless tobacco: Never  Vaping Use   Vaping status: Never Used  Substance Use Topics   Alcohol use: No   Drug use: No   No Known Allergies Medications Prior to Admission  Medication Sig Dispense Refill Last Dose/Taking   Prenatal Vit-Fe Fumarate-FA (PRENATAL MULTIVITAMIN) TABS tablet Take 1 tablet by mouth daily at 12 noon.   Past Week   ondansetron  (ZOFRAN -ODT) 4 MG disintegrating tablet Take 1 tablet (4 mg total) by mouth every 8 (eight) hours as needed for nausea or vomiting. 20 tablet 0     I have reviewed patient's Past Medical Hx, Surgical Hx, Family Hx, Social Hx,  medications and allergies.   ROS  Pertinent items noted in HPI and remainder of comprehensive ROS otherwise negative.   PHYSICAL EXAM  Patient Vitals for the past 24 hrs:  BP Temp Temp src Pulse Resp SpO2 Height Weight  05/23/23 1852 (!) 111/56 97.6 F (36.4 C) Oral 97 18 100 % 5\' 3"  (1.6 m) 58.7 kg    Physical Exam Vitals and nursing note reviewed.  Constitutional:      General: She is not in acute distress.    Appearance: Normal appearance. She is not ill-appearing, toxic-appearing or diaphoretic.  Cardiovascular:     Rate and Rhythm: Normal rate.  Pulmonary:     Effort: Pulmonary effort is normal.  Abdominal:       Comments: Approximately 2 in long abrasion.   Musculoskeletal:        General: Normal range of motion.  Skin:    General: Skin is warm and dry.     Capillary Refill: Capillary refill takes less than 2 seconds.  Neurological:     General: No focal deficit present.     Mental Status: She is alert and oriented to person, place,  and time.  Psychiatric:        Mood and Affect: Mood normal.        Behavior: Behavior normal.        Thought Content: Thought content normal.        Judgment: Judgment normal.         Fetal Tracing: Baseline: 140 Variability: moderate Accelerations: 15x15 Decelerations:absent Toco: irregular   Labs: No results found for this or any previous visit (from the past 24 hours).  Imaging:  No results found.  MDM & MAU COURSE  MDM: US  - BPP Reviewed EMR  MAU Course: Orders Placed This Encounter  Procedures   US  MFM FETAL BPP WO NON STRESS   No orders of the defined types were placed in this encounter.   ASSESSMENT   1. Abdominal trauma, initial encounter   2. Fall, initial encounter   3. Movement of fetus present during pregnancy in third trimester   4. NST (non-stress test) reactive   Reassuring assessment and US .   PLAN  Discharge home in stable condition. Return precautions advised.  Keep all routinely  scheduled prenatal care.   Allergies as of 05/23/2023   No Known Allergies     Raford Bunk, MSN, CNM 05/23/2023 9:06 PM  Certified Nurse Midwife, [facility]

## 2023-06-27 ENCOUNTER — Inpatient Hospital Stay (HOSPITAL_COMMUNITY)
Admission: AD | Admit: 2023-06-27 | Discharge: 2023-06-27 | Disposition: A | Discharge status: Home / Self Care | Attending: Student | Admitting: Student

## 2023-06-27 ENCOUNTER — Other Ambulatory Visit: Payer: Self-pay

## 2023-06-27 ENCOUNTER — Encounter (HOSPITAL_COMMUNITY): Payer: Self-pay | Admitting: Student

## 2023-06-27 DIAGNOSIS — R030 Elevated blood-pressure reading, without diagnosis of hypertension: Secondary | ICD-10-CM

## 2023-06-27 DIAGNOSIS — Z3A36 36 weeks gestation of pregnancy: Secondary | ICD-10-CM | POA: Diagnosis not present

## 2023-06-27 DIAGNOSIS — Z0371 Encounter for suspected problem with amniotic cavity and membrane ruled out: Secondary | ICD-10-CM | POA: Diagnosis not present

## 2023-06-27 DIAGNOSIS — O4703 False labor before 37 completed weeks of gestation, third trimester: Secondary | ICD-10-CM | POA: Insufficient documentation

## 2023-06-27 LAB — CBC
HCT: 28.1 % — ABNORMAL LOW (ref 36.0–46.0)
Hemoglobin: 8.5 g/dL — ABNORMAL LOW (ref 12.0–15.0)
MCH: 21.5 pg — ABNORMAL LOW (ref 26.0–34.0)
MCHC: 30.2 g/dL (ref 30.0–36.0)
MCV: 71 fL — ABNORMAL LOW (ref 80.0–100.0)
Platelets: 246 10*3/uL (ref 150–400)
RBC: 3.96 MIL/uL (ref 3.87–5.11)
RDW: 16.6 % — ABNORMAL HIGH (ref 11.5–15.5)
WBC: 11.8 10*3/uL — ABNORMAL HIGH (ref 4.0–10.5)
nRBC: 0 % (ref 0.0–0.2)

## 2023-06-27 LAB — COMPREHENSIVE METABOLIC PANEL WITH GFR
ALT: 13 U/L (ref 0–44)
AST: 25 U/L (ref 15–41)
Albumin: 2.7 g/dL — ABNORMAL LOW (ref 3.5–5.0)
Alkaline Phosphatase: 156 U/L — ABNORMAL HIGH (ref 38–126)
Anion gap: 10 (ref 5–15)
BUN: 8 mg/dL (ref 6–20)
CO2: 20 mmol/L — ABNORMAL LOW (ref 22–32)
Calcium: 8.9 mg/dL (ref 8.9–10.3)
Chloride: 104 mmol/L (ref 98–111)
Creatinine, Ser: 0.53 mg/dL (ref 0.44–1.00)
GFR, Estimated: >60 mL/min (ref >60)
Glucose, Bld: 85 mg/dL (ref 70–99)
Potassium: 3.5 mmol/L (ref 3.5–5.1)
Sodium: 134 mmol/L — ABNORMAL LOW (ref 135–145)
Total Bilirubin: 0.6 mg/dL (ref 0.0–1.2)
Total Protein: 6.4 g/dL — ABNORMAL LOW (ref 6.5–8.1)

## 2023-06-27 LAB — POCT FERN TEST: POCT Fern Test: NEGATIVE

## 2023-06-27 LAB — WET PREP, GENITAL
Clue Cells Wet Prep HPF POC: NONE SEEN
Sperm: NONE SEEN
Trich, Wet Prep: NONE SEEN
WBC, Wet Prep HPF POC: >=10 — AB (ref <10)
Yeast Wet Prep HPF POC: NONE SEEN

## 2023-06-27 LAB — PROTEIN / CREATININE RATIO, URINE
Creatinine, Urine: 42 mg/dL
Total Protein, Urine: <6 mg/dL

## 2023-06-27 NOTE — MAU Note (Signed)
 Possible rupture of membrane around 1745, unsure of the color of the fluid, Positive FM, Pt stated she's being contracting since 1130 this morning, was around apart, now more consistent and intense.

## 2023-06-27 NOTE — MAU Provider Note (Signed)
 History     [id number]  Arrival date and time: 06/27/23 1935    Chief Complaint  Patient presents with   Rupture of Membranes   Contractions     HPI Karen Yang is a 28 y.o. at [redacted]w[redacted]d with PMHx notable for one prior cesarean due to breech, bicorunate uterus, who presents for possible leaking fluid and contractions.   Review of outside prenatal records from [facility] (in media tab): no new records since 04/01/2023, but at that time unremarkable course, normal BP's  Patient reports earlier today felt some leaking of fluid that persisted for a bit but then stopped, has had some discharge while in MAU but no leaking or gushing No bleeding Normal fetal movement Has also felt more strong and persistent contractions since this episode of leaking  Currently at this moment denies vision changes, chest pain, shortness of breath, right upper quadrant pain      OB History     Gravida  2   Para  1   Term  0   Preterm  1   AB  0   Living  1      SAB  0   IAB  0   Ectopic  0   Multiple  0   Live Births  1           Past Medical History:  Diagnosis Date   Anemia    Asthma    Stress induced   Blood transfusion without reported diagnosis    as an infant   Dysmenorrhea     Past Surgical History:  Procedure Laterality Date   CESAREAN SECTION N/A 07/24/2021   Procedure: CESAREAN SECTION;  Surgeon: Rogene Claude, MD;  Location: [facility];  Service: Obstetrics;  Laterality: N/A;   wisdom teeth removal      Family History  Problem Relation Age of Onset   Thyroid disease Mother    Ovarian cysts Sister        half sister, aged 52 when cyst was removed    Social History   Socioeconomic History   Marital status: Married    Spouse name: Not on file   Number of children: Not on file   Years of education: Not on file   Highest education level: Not on file  Occupational History   Not on file  Tobacco Use   Smoking status: Never    Smokeless tobacco: Never  Vaping Use   Vaping status: Never Used  Substance and Sexual Activity   Alcohol use: No   Drug use: No   Sexual activity: Yes    Birth control/protection: None  Other Topics Concern   Not on file  Social History Narrative   Not on file   Social Drivers of Health   Financial Resource Strain: Not on file  Food Insecurity: No Food Insecurity (06/27/2023)   Hunger Vital Sign    Worried About Running Out of Food in the Last Year: Never true    Ran Out of Food in the Last Year: Never true  Transportation Needs: No Transportation Needs (06/27/2023)   PRAPARE - Administrator, Civil Service (Medical): No    Lack of Transportation (Non-Medical): No  Physical Activity: Not on file  Stress: Not on file  Social Connections: Not on file  Intimate Partner Violence: Not At Risk (06/27/2023)   Humiliation, Afraid, Rape, and Kick questionnaire    Fear of Current or Ex-Partner: No    Emotionally Abused: No  Physically Abused: No    Sexually Abused: No    No Known Allergies  No current facility-administered medications on file prior to encounter.   Current Outpatient Medications on File Prior to Encounter  Medication Sig Dispense Refill   Prenatal Vit-Fe Fumarate-FA (PRENATAL MULTIVITAMIN) TABS tablet Take 1 tablet by mouth daily at 12 noon.       ROS Pertinent positives and negative per HPI, all others reviewed and negative  Physical Exam   BP 126/72   Pulse (!) 102   Temp 98.8 F (37.1 C) (Oral)   Resp 17   Ht 5\' 3"  (1.6 m)   Wt 62.6 kg   SpO2 99%   BMI 24.45 kg/m   Patient Vitals for the past 24 hrs:  BP Temp Temp src Pulse Resp SpO2 Height Weight  06/27/23 2100 126/72 -- -- (!) 102 -- -- -- --  06/27/23 2048 -- -- -- -- -- -- 5\' 3"  (1.6 m) 62.6 kg  06/27/23 2045 (!) 140/91 -- -- (!) 107 -- -- -- --  06/27/23 2030 123/73 -- -- (!) 101 -- -- -- --  06/27/23 2011 (!) 140/74 98.8 F (37.1 C) Oral 100 17 99 % -- --  06/27/23 2007 -- --  -- -- -- 100 % -- --    Physical Exam Vitals reviewed.  Constitutional:      General: She is not in acute distress.    Appearance: She is well-developed. She is not diaphoretic.  Eyes:     General: No scleral icterus. Pulmonary:     Effort: Pulmonary effort is normal. No respiratory distress.  Abdominal:     General: There is no distension.     Palpations: Abdomen is soft.     Tenderness: There is no abdominal tenderness. There is no guarding or rebound.  Genitourinary:    Comments: Vaginal vault with negative pool, negative valsalva. Cervix visually long and closed. Fern negative. Small amount of white discharge, physiologic in appearance Skin:    General: Skin is warm and dry.  Neurological:     Mental Status: She is alert.     Coordination: Coordination normal.      Cervical Exam    Bedside Ultrasound Pt informed that the ultrasound is considered a limited OB ultrasound and is not intended to be a complete ultrasound exam.  Patient also informed that the ultrasound is not being completed with the intent of assessing for fetal or placental anomalies or any pelvic abnormalities.  Explained that the purpose of today's ultrasound is to assess for  AFI.  Patient acknowledges the purpose of the exam and the limitations of the study.      My interpretation: grossly normal fluid in all four quadrants  FHT Baseline: 130 bpm Variability: Good {> 6 bpm) Accelerations: Reactive Decelerations: Absent Uterine activity: irregular q5-9 min Cat: I  Labs Results for orders placed or performed during the hospital encounter of 06/27/23 (from the past 24 hours)  Fern Test     Status: Normal   Collection Time: 06/27/23  8:23 PM  Result Value Ref Range   POCT Fern Test Negative = intact amniotic membranes   Wet prep, genital     Status: Abnormal   Collection Time: 06/27/23  8:56 PM  Result Value Ref Range   Yeast Wet Prep HPF POC NONE SEEN NONE SEEN   Trich, Wet Prep NONE SEEN NONE  SEEN   Clue Cells Wet Prep HPF POC NONE SEEN NONE SEEN   WBC, Wet  Prep HPF POC >=10 (A) <10   Sperm NONE SEEN     Imaging No results found.  MAU Course  Procedures Lab Orders         Wet prep, genital         CBC         Comprehensive metabolic panel with GFR         Protein / creatinine ratio, urine         Fern Test    No orders of the defined types were placed in this encounter.  Imaging Orders  No imaging studies ordered today    MDM Moderate (Level 3-4)  Assessment and Plan  #Encounter for suspected rupture of membranes, with rupture of membranes not found #[redacted] weeks gestation of pregnancy Ruled out for rupture on exam, fluid grossly normal on bedside US . Wet prep unremarkable.   #Elevated blood pressure without diagnosis of hypertension A few intermittent mild range BP's. Asymptomatic. Baseline labs obtained, Dr. Felipe Horton notified per protocol so outpatient follow up can be arranged.  #FWB FHT Cat I NST: Reactive   Dispo: discharged to home in stable condition    Teena Feast, MD/MPH 06/27/23 9:18 PM  Allergies as of 06/27/2023   No Known Allergies      Medication List     TAKE these medications    prenatal multivitamin Tabs tablet Take 1 tablet by mouth daily at 12 noon.

## 2023-07-05 ENCOUNTER — Non-Acute Institutional Stay (HOSPITAL_COMMUNITY)
Admission: RE | Admit: 2023-07-05 | Discharge: 2023-07-05 | Disposition: A | Discharge status: Home / Self Care | Source: Ambulatory Visit | Attending: Internal Medicine | Admitting: Internal Medicine

## 2023-07-05 DIAGNOSIS — D649 Anemia, unspecified: Secondary | ICD-10-CM | POA: Diagnosis present

## 2023-07-05 MED ORDER — SODIUM CHLORIDE 0.9 % IV SOLN: INTRAVENOUS | Status: DC | PRN

## 2023-07-05 MED ORDER — SODIUM CHLORIDE 0.9 % IV SOLN
510.0000 mg | Freq: Once | INTRAVENOUS | Status: AC
  Administered 2023-07-05: 510 mg via INTRAVENOUS
  Filled 2023-07-05: qty 17

## 2023-07-05 NOTE — Progress Notes (Signed)
 PATIENT CARE CENTER NOTE   Diagnosis: D64.9   Provider: Jeneane Miracle, DO   Procedure: Feraheme infusion    Note: Patient received Feraheme infusion (dose # 1 of 2) via PIV. Patient tolerated infusion well with no adverse reaction. Observed patient for 30 minutes post infusion. Vital signs stable. Discharge instructions given to patient. Patient to come back in 1 week and will schedule next appointment at the front desk. Patient alert, oriented and ambulatory at discharge.

## 2023-07-08 ENCOUNTER — Encounter (HOSPITAL_COMMUNITY): Payer: Self-pay | Admitting: *Deleted

## 2023-07-08 ENCOUNTER — Telehealth (HOSPITAL_COMMUNITY): Payer: Self-pay | Admitting: *Deleted

## 2023-07-08 NOTE — Telephone Encounter (Signed)
 Preadmission screen

## 2023-07-09 ENCOUNTER — Inpatient Hospital Stay (HOSPITAL_COMMUNITY): Admitting: Anesthesiology

## 2023-07-09 ENCOUNTER — Other Ambulatory Visit: Payer: Self-pay

## 2023-07-09 ENCOUNTER — Inpatient Hospital Stay (HOSPITAL_COMMUNITY)
Admission: AD | Admit: 2023-07-09 | Discharge: 2023-07-13 | DRG: 787 | Disposition: A | Discharge status: Home / Self Care | Payer: Self-pay | Attending: Student | Admitting: Student

## 2023-07-09 ENCOUNTER — Encounter (HOSPITAL_COMMUNITY): Payer: Self-pay | Admitting: Student

## 2023-07-09 ENCOUNTER — Inpatient Hospital Stay (HOSPITAL_COMMUNITY)

## 2023-07-09 DIAGNOSIS — D62 Acute posthemorrhagic anemia: Secondary | ICD-10-CM | POA: Diagnosis not present

## 2023-07-09 DIAGNOSIS — O34211 Maternal care for low transverse scar from previous cesarean delivery: Secondary | ICD-10-CM | POA: Diagnosis present

## 2023-07-09 DIAGNOSIS — O403XX0 Polyhydramnios, third trimester, not applicable or unspecified: Secondary | ICD-10-CM | POA: Diagnosis present

## 2023-07-09 DIAGNOSIS — O9081 Anemia of the puerperium: Secondary | ICD-10-CM | POA: Diagnosis not present

## 2023-07-09 DIAGNOSIS — O3403 Maternal care for unspecified congenital malformation of uterus, third trimester: Secondary | ICD-10-CM | POA: Diagnosis present

## 2023-07-09 DIAGNOSIS — O3663X0 Maternal care for excessive fetal growth, third trimester, not applicable or unspecified: Secondary | ICD-10-CM | POA: Diagnosis present

## 2023-07-09 DIAGNOSIS — J4520 Mild intermittent asthma, uncomplicated: Secondary | ICD-10-CM | POA: Diagnosis present

## 2023-07-09 DIAGNOSIS — Z98891 History of uterine scar from previous surgery: Secondary | ICD-10-CM

## 2023-07-09 DIAGNOSIS — Z23 Encounter for immunization: Secondary | ICD-10-CM

## 2023-07-09 DIAGNOSIS — Z3A37 37 weeks gestation of pregnancy: Secondary | ICD-10-CM

## 2023-07-09 DIAGNOSIS — Q513 Bicornate uterus: Secondary | ICD-10-CM | POA: Diagnosis not present

## 2023-07-09 DIAGNOSIS — Z3A38 38 weeks gestation of pregnancy: Secondary | ICD-10-CM | POA: Diagnosis not present

## 2023-07-09 DIAGNOSIS — O9952 Diseases of the respiratory system complicating childbirth: Secondary | ICD-10-CM | POA: Diagnosis present

## 2023-07-09 LAB — CBC
HCT: 30.7 % — ABNORMAL LOW (ref 36.0–46.0)
Hemoglobin: 9.1 g/dL — ABNORMAL LOW (ref 12.0–15.0)
MCH: 21.2 pg — ABNORMAL LOW (ref 26.0–34.0)
MCHC: 29.6 g/dL — ABNORMAL LOW (ref 30.0–36.0)
MCV: 71.4 fL — ABNORMAL LOW (ref 80.0–100.0)
Platelets: 274 10*3/uL (ref 150–400)
RBC: 4.3 MIL/uL (ref 3.87–5.11)
RDW: 18.4 % — ABNORMAL HIGH (ref 11.5–15.5)
WBC: 11.1 10*3/uL — ABNORMAL HIGH (ref 4.0–10.5)
nRBC: 0.5 % — ABNORMAL HIGH (ref 0.0–0.2)

## 2023-07-09 LAB — RPR: RPR Ser Ql: NONREACTIVE

## 2023-07-09 MED ORDER — OXYTOCIN-SODIUM CHLORIDE 30-0.9 UT/500ML-% IV SOLN
1.0000 m[IU]/min | INTRAVENOUS | Status: DC
  Filled 2023-07-09: qty 500

## 2023-07-09 MED ORDER — ONDANSETRON HCL 4 MG/2ML IJ SOLN: 4.0000 mg | Freq: Four times a day (QID) | INTRAMUSCULAR | Status: DC | PRN

## 2023-07-09 MED ORDER — EPHEDRINE 5 MG/ML INJ: 10.0000 mg | INTRAVENOUS | Status: DC | PRN

## 2023-07-09 MED ORDER — OXYTOCIN BOLUS FROM INFUSION: 333.0000 mL | Freq: Once | INTRAVENOUS | Status: DC

## 2023-07-09 MED ORDER — OXYCODONE-ACETAMINOPHEN 5-325 MG PO TABS: 2.0000 | ORAL_TABLET | ORAL | Status: DC | PRN

## 2023-07-09 MED ORDER — LIDOCAINE HCL (PF) 1 % IJ SOLN
INTRAMUSCULAR | Status: DC | PRN
  Administered 2023-07-09: 11 mL via EPIDURAL

## 2023-07-09 MED ORDER — FENTANYL-BUPIVACAINE-NACL 0.5-0.125-0.9 MG/250ML-% EP SOLN
12.0000 mL/h | EPIDURAL | Status: DC | PRN
  Administered 2023-07-09: 12 mL/h via EPIDURAL
  Filled 2023-07-09 (×2): qty 250

## 2023-07-09 MED ORDER — TERBUTALINE SULFATE 1 MG/ML IJ SOLN: 0.2500 mg | Freq: Once | INTRAMUSCULAR | Status: DC | PRN

## 2023-07-09 MED ORDER — LACTATED RINGERS IV SOLN
500.0000 mL | Freq: Once | INTRAVENOUS | Status: DC
Start: 2023-07-09 — End: 2023-07-10

## 2023-07-09 MED ORDER — OXYTOCIN-SODIUM CHLORIDE 30-0.9 UT/500ML-% IV SOLN: 1.0000 m[IU]/min | INTRAVENOUS | Status: DC

## 2023-07-09 MED ORDER — SOD CITRATE-CITRIC ACID 500-334 MG/5ML PO SOLN: 30.0000 mL | ORAL | Status: DC | PRN

## 2023-07-09 MED ORDER — PHENYLEPHRINE 80 MCG/ML (10ML) SYRINGE FOR IV PUSH (FOR BLOOD PRESSURE SUPPORT): 80.0000 ug | PREFILLED_SYRINGE | INTRAVENOUS | Status: DC | PRN

## 2023-07-09 MED ORDER — LACTATED RINGERS IV SOLN: 500.0000 mL | INTRAVENOUS | Status: DC | PRN

## 2023-07-09 MED ORDER — LIDOCAINE HCL (PF) 1 % IJ SOLN: 30.0000 mL | INTRAMUSCULAR | Status: DC | PRN

## 2023-07-09 MED ORDER — OXYCODONE-ACETAMINOPHEN 5-325 MG PO TABS: 1.0000 | ORAL_TABLET | ORAL | Status: DC | PRN

## 2023-07-09 MED ORDER — DIPHENHYDRAMINE HCL 50 MG/ML IJ SOLN: 12.5000 mg | INTRAMUSCULAR | Status: DC | PRN

## 2023-07-09 MED ORDER — OXYTOCIN-SODIUM CHLORIDE 30-0.9 UT/500ML-% IV SOLN: 2.5000 [IU]/h | INTRAVENOUS | Status: DC

## 2023-07-09 MED ORDER — ACETAMINOPHEN 325 MG PO TABS: 650.0000 mg | ORAL_TABLET | ORAL | Status: DC | PRN

## 2023-07-09 MED ORDER — LACTATED RINGERS IV SOLN: INTRAVENOUS | Status: DC

## 2023-07-09 MED ORDER — FENTANYL CITRATE (PF) 100 MCG/2ML IJ SOLN: 50.0000 ug | INTRAMUSCULAR | Status: DC | PRN

## 2023-07-09 NOTE — Progress Notes (Signed)
 Jonquil Stubbe is a 28 y.o. G2P0101 at [redacted]w[redacted]d  Objective: BP 117/69   Pulse 90   Temp 98.8 F (37.1 C) (Oral)   Resp 14   Ht 5' 3 (1.6 m)   Wt 63 kg   SpO2 99%   BMI 24.59 kg/m  No intake/output data recorded. No intake/output data recorded.  FHT:  FHR: 120 bpm, variability: moderate,  accelerations:  Abscent,  decelerations:  Absent UC:   irregular, every 1-5 minutes SVE:   Dilation: 3 Effacement (%): 90 Station: -1 Exam by:: Dr. Felipe Horton  Labs: Lab Results  Component Value Date   WBC 11.1 (H) 07/09/2023   HGB 9.1 (L) 07/09/2023   HCT 30.7 (L) 07/09/2023   MCV 71.4 (L) 07/09/2023   PLT 274 07/09/2023    Assessment / Plan: 28 yo G2P1001 @ 37.6 IOL TOLAC for severe polyhydramnios  IOL: s/p AROM. Pitocin  on 4, spacing out since recent epidural. Increase per protocol Fetal Wellbeing:  Initially cat II with minimal variability immediately following epidural, now category I Pain Control:  Comfortable with epidural Anticipated MOD:  NSVD  Leanne Pronto, DO 07/09/2023, 1:52 PM

## 2023-07-09 NOTE — Progress Notes (Signed)
 Karen Yang is a 28 y.o. G2P0101 at [redacted]w[redacted]d   Ambulating in the halls  Objective: BP 109/62   Pulse 88   Temp 98 F (36.7 C) (Oral)   Resp 19   Ht 5' 3 (1.6 m)   Wt 63 kg   BMI 24.59 kg/m  No intake/output data recorded. No intake/output data recorded.  FHT:  FHR: 140 bpm, variability: moderate,  accelerations:  Present,  decelerations:  Absent UC:   regular, every 2-3 minutes SVE:   2-3/70/-2 BSUS: cephalic  Labs: Lab Results  Component Value Date   WBC 11.1 (H) 07/09/2023   HGB 9.1 (L) 07/09/2023   HCT 30.7 (L) 07/09/2023   MCV 71.4 (L) 07/09/2023   PLT 274 07/09/2023    Assessment / Plan: 28 yo G2P1001 @ 37.6, IOL TOLAC for severe polyhydramnios  IOL: AROM copious clear fluid, throne position to encourage fetal head engagement Fetal Wellbeing:  Category I Pain Control:  Labor support without medications Anticipated MOD:  NSVD  Leanne Pronto, DO 07/09/2023, 10:31 AM

## 2023-07-09 NOTE — Progress Notes (Signed)
 Karen Yang is a 28 y.o. G2P0101 at [redacted]w[redacted]d  Pain on right side   Objective: BP (!) 102/51   Pulse 78   Temp 97.9 F (36.6 C) (Oral)   Resp 16   Ht 5' 3 (1.6 m)   Wt 63 kg   SpO2 100%   BMI 24.59 kg/m  No intake/output data recorded. No intake/output data recorded.  FHT:  FHR: 125, moderate variability, accelerations: absent, decelerations: absent UC:   irregular, every 2-5 minutes SVE:   Dilation: 8 Effacement (%): 90 Station: -1 Exam by:: Felipe Horton MD  Labs: Lab Results  Component Value Date   WBC 11.1 (H) 07/09/2023   HGB 9.1 (L) 07/09/2023   HCT 30.7 (L) 07/09/2023   MCV 71.4 (L) 07/09/2023   PLT 274 07/09/2023    Assessment / Plan: 28 yo G2P1001 @ 37.6 IOL TOLAC for severe polyhydramnios   IOL: s/p AROM. Pitocin  on 18. Progressing well Fetal Wellbeing:  category I Pain Control:  Repositioning to optimize pain control from epidural Anticipated MOD:  NSVD  Leanne Pronto, DO 07/09/2023, 8:17 PM

## 2023-07-09 NOTE — Anesthesia Preprocedure Evaluation (Signed)
 Anesthesia Evaluation  Patient identified by MRN, date of birth, ID band Patient awake    Reviewed: Allergy & Precautions, NPO status , Patient's Chart, lab work & pertinent test results  History of Anesthesia Complications Negative for: history of anesthetic complications  Airway Mallampati: I  TM Distance: >3 FB Neck ROM: Full    Dental no notable dental hx.    Pulmonary asthma    Pulmonary exam normal        Cardiovascular negative cardio ROS Normal cardiovascular exam     Neuro/Psych negative neurological ROS  negative psych ROS   GI/Hepatic negative GI ROS, Neg liver ROS,,,  Endo/Other  negative endocrine ROS    Renal/GU negative Renal ROS  negative genitourinary   Musculoskeletal negative musculoskeletal ROS (+)    Abdominal   Peds  Hematology negative hematology ROS (+)   Anesthesia Other Findings Day of surgery medications reviewed with patient.  Reproductive/Obstetrics (+) Pregnancy (breech presentation)                             Anesthesia Physical Anesthesia Plan  ASA: 2  Anesthesia Plan: Epidural   Post-op Pain Management:    Induction:   PONV Risk Score and Plan: Treatment may vary due to age or medical condition  Airway Management Planned: Natural Airway  Additional Equipment: None  Intra-op Plan:   Post-operative Plan:   Informed Consent: I have reviewed the patients History and Physical, chart, labs and discussed the procedure including the risks, benefits and alternatives for the proposed anesthesia with the patient or authorized representative who has indicated his/her understanding and acceptance.       Plan Discussed with: CRNA  Anesthesia Plan Comments:         Anesthesia Quick Evaluation

## 2023-07-09 NOTE — Progress Notes (Signed)
 Necha Harries is a 28 y.o. G2P0101 at [redacted]w[redacted]d   Objective: BP (!) 103/57   Pulse 90   Temp 98 F (36.7 C) (Oral)   Resp 16   Ht 5' 3 (1.6 m)   Wt 63 kg   SpO2 100%   BMI 24.59 kg/m  No intake/output data recorded. No intake/output data recorded.  FHT:  FHR: 130 bpm, variability: moderate,  accelerations:  Present,  decelerations:  Absent UC:   regular, every 1-2 minutes SVE:   Dilation: 6 Effacement (%): 90 Station: -1 Exam by:: Dr. Felipe Horton  Labs: Lab Results  Component Value Date   WBC 11.1 (H) 07/09/2023   HGB 9.1 (L) 07/09/2023   HCT 30.7 (L) 07/09/2023   MCV 71.4 (L) 07/09/2023   PLT 274 07/09/2023    Assessment / Plan: 28 yo G2P1001 @ 37.6 IOL TOLAC for severe polyhydramnios   IOL: s/p AROM. Pitocin  on 16. Now in active labor Fetal Wellbeing:  category I Pain Control:  Comfortable with epidural Anticipated MOD:  NSVD  Leanne Pronto, DO 07/09/2023, 6:26 PM

## 2023-07-09 NOTE — H&P (Signed)
 Karen Yang is a 28 y.o. female presenting for scheduled induction of labor.  Pregnancy is complicated by:  - Severe polyhydramnios: AFI this week - Bicornuate uterus - History of previous c-section x 1: for malpresentation - Iron  deficiency anemia: was not taking PO iron . Hb 8.8 on 5/28. S/p IV iron  x 1, 2nd one was scheduled for next week - LGA fetus: EFW 82% with AC >99% on 5/28. 1 hour GTT 135, 3 hour GTT 1/4 abnormal x2. Accuchecks 90% wnl, rare elevated PP - Asthma: mild intermittent   +FM, +CTX. Denies LOF or bleeding. OB History     Gravida  2   Para  1   Term  0   Preterm  1   AB  0   Living  1      SAB  0   IAB  0   Ectopic  0   Multiple  0   Live Births  1          Past Medical History:  Diagnosis Date   Anemia    Asthma    Stress induced   Blood transfusion without reported diagnosis    as an infant   Dysmenorrhea    Past Surgical History:  Procedure Laterality Date   CESAREAN SECTION N/A 07/24/2021   Procedure: CESAREAN SECTION;  Surgeon: Rogene Claude, MD;  Location: [facility] LD ORS;  Service: Obstetrics;  Laterality: N/A;   wisdom teeth removal     Family History: family history includes Ovarian cysts in her sister; Thyroid disease in her mother. Social History:  reports that she has never smoked. She has never used smokeless tobacco. She reports that she does not drink alcohol and does not use drugs.     Maternal Diabetes: No Genetic Screening: Normal Maternal Ultrasounds/Referrals: Bicornuate uterus Fetal Ultrasounds or other Referrals:  LGA, poly Maternal Substance Abuse:  No Significant Maternal Medications:  None Significant Maternal Lab Results:  Group B Strep negative Number of Prenatal Visits:greater than 3 verified prenatal visits Maternal Vaccinations:TDap Other Comments:  None  Review of Systems  Respiratory:  Positive for shortness of breath.   Cardiovascular:  Negative for chest pain.  Gastrointestinal:  Negative  for abdominal pain and constipation.  Genitourinary:  Positive for pelvic pain. Negative for vaginal bleeding and vaginal discharge.  Neurological:  Negative for headaches.   History SVE: 2/60/-2 CTX: q2-3 Today's Vitals   07/09/23 0328 07/09/23 0631 07/09/23 0721 07/09/23 0738  BP:  109/62    Pulse:  88    Resp:  19    Temp:  98 F (36.7 C)    TempSrc:  Oral    Weight:      Height:      PainSc: Asleep 2  4  4     Body mass index is 24.59 kg/m.  Exam Physical Exam Constitutional:      General: She is not in acute distress.    Appearance: Normal appearance.  HENT:     Head: Normocephalic and atraumatic.  Pulmonary:     Effort: Pulmonary effort is normal.   Musculoskeletal:        General: Normal range of motion.   Skin:    General: Skin is warm and dry.   Neurological:     Mental Status: She is alert.   Psychiatric:        Mood and Affect: Mood normal.        Behavior: Behavior normal.     Prenatal labs: ABO, Rh: --/--/O POS (  06/13 0100) Antibody: NEG (06/13 0100) Rubella: Nonimmune (11/21 0000) RPR: Nonreactive (11/21 0000)  HBsAg: Negative (11/21 0000)  HIV: Non-reactive (11/21 0000)  GBS:   Negative  Assessment/Plan: 28 yo G2P1001 @ 37.6, IOL for TOLAC in setting of severe polyhydramnios - IOL: Pitocin  on 3 Fetal head ballotable. Will increase mobility and attempt controlled AROM once head more applied  - Planning epidural  - Anemia: Hb 9.1 on admission. She has multiple risk factors for increased bleeding with delivery (poly, LGA, TOLAC). Careful attention to decrease blood loss   Leanne Pronto 07/09/2023, 7:34 AM

## 2023-07-09 NOTE — Progress Notes (Signed)
 Dawnetta Copenhaver is a 28 y.o. G2P0101 at [redacted]w[redacted]d   Subjective: Intermittent pressure, shakes  Objective: BP 126/75   Pulse 98   Temp 98.2 F (36.8 C) (Oral)   Resp 16   Ht 5' 3 (1.6 m)   Wt 63 kg   SpO2 100%   BMI 24.59 kg/m  No intake/output data recorded. Total I/O In: -  Out: 800 [Urine:800]  FHT:  FHR: 135 bpm, variability: moderate,  accelerations:  Present,  decelerations:  Absent UC:   difficult to assess SVE:   Dilation: 9 Effacement (%): 90 Station: 0 Exam by:: Felipe Horton MD  Labs: Lab Results  Component Value Date   WBC 11.1 (H) 07/09/2023   HGB 9.1 (L) 07/09/2023   HCT 30.7 (L) 07/09/2023   MCV 71.4 (L) 07/09/2023   PLT 274 07/09/2023    Assessment / Plan: 28 yo G2P1001 @ 37.6 IOL TOLAC for severe polyhydramnios   IOL: s/p AROM. Pitocin  on 22. IUPC placed within right uterine horn to assess CTX more accurately Fetal Wellbeing:  category I Pain Control:  Comfortable with epidural Anticipated MOD:  NSVD  Leanne Pronto, DO 07/09/2023, 11:55 PM

## 2023-07-09 NOTE — Anesthesia Procedure Notes (Signed)
 Epidural Patient location during procedure: OB Start time: 07/09/2023 1:10 PM End time: 07/09/2023 1:23 PM  Staffing Anesthesiologist: Earvin Goldberg, MD Performed: anesthesiologist   Preanesthetic Checklist Completed: patient identified, IV checked, site marked, risks and benefits discussed, surgical consent, monitors and equipment checked, pre-op evaluation and timeout performed  Epidural Patient position: sitting Prep: ChloraPrep Patient monitoring: heart rate, cardiac monitor, continuous pulse ox and blood pressure Approach: midline Location: L2-L3 Injection technique: LOR saline  Needle:  Needle type: Tuohy  Needle gauge: 17 G Needle length: 9 cm Needle insertion depth: 4 cm Catheter type: closed end flexible Catheter size: 20 Guage Test dose: negative  Assessment Events: blood not aspirated, injection not painful, no injection resistance, no paresthesia and negative IV test  Additional Notes Reason for block:procedure for pain

## 2023-07-10 ENCOUNTER — Encounter (HOSPITAL_COMMUNITY): Payer: Self-pay | Admitting: Student

## 2023-07-10 ENCOUNTER — Encounter (HOSPITAL_COMMUNITY)
Admission: AD | Disposition: A | Discharge status: Home / Self Care | Payer: Self-pay | Source: Home / Self Care | Attending: Student

## 2023-07-10 ENCOUNTER — Other Ambulatory Visit: Payer: Self-pay

## 2023-07-10 DIAGNOSIS — O3403 Maternal care for unspecified congenital malformation of uterus, third trimester: Secondary | ICD-10-CM

## 2023-07-10 DIAGNOSIS — Z3A38 38 weeks gestation of pregnancy: Secondary | ICD-10-CM

## 2023-07-10 DIAGNOSIS — O403XX0 Polyhydramnios, third trimester, not applicable or unspecified: Secondary | ICD-10-CM

## 2023-07-10 DIAGNOSIS — O3663X0 Maternal care for excessive fetal growth, third trimester, not applicable or unspecified: Secondary | ICD-10-CM

## 2023-07-10 DIAGNOSIS — O34211 Maternal care for low transverse scar from previous cesarean delivery: Secondary | ICD-10-CM

## 2023-07-10 LAB — CBC
HCT: 26.9 % — ABNORMAL LOW (ref 36.0–46.0)
Hemoglobin: 7.9 g/dL — ABNORMAL LOW (ref 12.0–15.0)
MCH: 21.2 pg — ABNORMAL LOW (ref 26.0–34.0)
MCHC: 29.4 g/dL — ABNORMAL LOW (ref 30.0–36.0)
MCV: 72.3 fL — ABNORMAL LOW (ref 80.0–100.0)
Platelets: 227 10*3/uL (ref 150–400)
RBC: 3.72 MIL/uL — ABNORMAL LOW (ref 3.87–5.11)
RDW: 18.8 % — ABNORMAL HIGH (ref 11.5–15.5)
WBC: 20.9 10*3/uL — ABNORMAL HIGH (ref 4.0–10.5)
nRBC: 0 % (ref 0.0–0.2)

## 2023-07-10 SURGERY — Surgical Case
Anesthesia: Epidural

## 2023-07-10 MED ORDER — OXYTOCIN-SODIUM CHLORIDE 30-0.9 UT/500ML-% IV SOLN: 2.5000 [IU]/h | INTRAVENOUS | Status: AC

## 2023-07-10 MED ORDER — OXYTOCIN-SODIUM CHLORIDE 30-0.9 UT/500ML-% IV SOLN
INTRAVENOUS | Status: DC | PRN
  Administered 2023-07-10: 30 [IU] via INTRAVENOUS

## 2023-07-10 MED ORDER — MORPHINE SULFATE (PF) 0.5 MG/ML IJ SOLN
INTRAMUSCULAR | Status: DC | PRN
  Administered 2023-07-10: 3 mg via EPIDURAL

## 2023-07-10 MED ORDER — MENTHOL 3 MG MT LOZG: 1.0000 | LOZENGE | OROMUCOSAL | Status: DC | PRN

## 2023-07-10 MED ORDER — METHYLERGONOVINE MALEATE 0.2 MG/ML IJ SOLN
INTRAMUSCULAR | Status: DC | PRN
  Administered 2023-07-10: .2 mg via INTRAMUSCULAR

## 2023-07-10 MED ORDER — PROPOFOL 10 MG/ML IV BOLUS
INTRAVENOUS | Status: DC | PRN
  Administered 2023-07-10: 100 mg via INTRAVENOUS

## 2023-07-10 MED ORDER — TRANEXAMIC ACID-NACL 1000-0.7 MG/100ML-% IV SOLN
INTRAVENOUS | Status: DC | PRN
Start: 2023-07-10 — End: 2023-07-10
  Administered 2023-07-10: 1000 mg via INTRAVENOUS

## 2023-07-10 MED ORDER — CEFAZOLIN SODIUM-DEXTROSE 2-3 GM-%(50ML) IV SOLR
INTRAVENOUS | Status: DC | PRN
Start: 2023-07-10 — End: 2023-07-10
  Administered 2023-07-10: 2 g via INTRAVENOUS

## 2023-07-10 MED ORDER — CEFAZOLIN SODIUM-DEXTROSE 2-4 GM/100ML-% IV SOLN
2.0000 g | INTRAVENOUS | Status: DC
Start: 2023-07-10 — End: 2023-07-10

## 2023-07-10 MED ORDER — GABAPENTIN 100 MG PO CAPS
100.0000 mg | ORAL_CAPSULE | Freq: Two times a day (BID) | ORAL | Status: DC
  Administered 2023-07-10 – 2023-07-13 (×6): 100 mg via ORAL
  Filled 2023-07-10 (×7): qty 1

## 2023-07-10 MED ORDER — COCONUT OIL OIL: 1.0000 | TOPICAL_OIL | Status: DC | PRN

## 2023-07-10 MED ORDER — SUCCINYLCHOLINE CHLORIDE 200 MG/10ML IV SOSY
PREFILLED_SYRINGE | INTRAVENOUS | Status: AC
Start: 2023-07-10 — End: 2023-07-10
  Filled 2023-07-10: qty 10

## 2023-07-10 MED ORDER — PRENATAL MULTIVITAMIN CH
1.0000 | ORAL_TABLET | Freq: Every day | ORAL | Status: DC
  Administered 2023-07-12 – 2023-07-13 (×2): 1 via ORAL
  Filled 2023-07-10 (×4): qty 1

## 2023-07-10 MED ORDER — FENTANYL CITRATE (PF) 250 MCG/5ML IJ SOLN
INTRAMUSCULAR | Status: AC
Start: 2023-07-10 — End: 2023-07-10
  Filled 2023-07-10: qty 5

## 2023-07-10 MED ORDER — ONDANSETRON HCL 4 MG/2ML IJ SOLN
INTRAMUSCULAR | Status: AC
Start: 2023-07-10 — End: 2023-07-10
  Filled 2023-07-10: qty 2

## 2023-07-10 MED ORDER — FAMOTIDINE IN NACL 20-0.9 MG/50ML-% IV SOLN
20.0000 mg | Freq: Once | INTRAVENOUS | Status: AC
  Filled 2023-07-10: qty 50

## 2023-07-10 MED ORDER — DIPHENHYDRAMINE HCL 25 MG PO CAPS: 25.0000 mg | ORAL_CAPSULE | Freq: Four times a day (QID) | ORAL | Status: DC | PRN

## 2023-07-10 MED ORDER — LACTATED RINGERS IV SOLN: INTRAVENOUS | Status: DC | PRN

## 2023-07-10 MED ORDER — OXYCODONE HCL 5 MG PO TABS
5.0000 mg | ORAL_TABLET | ORAL | Status: DC | PRN
  Administered 2023-07-11: 5 mg via ORAL
  Filled 2023-07-10: qty 1

## 2023-07-10 MED ORDER — ONDANSETRON HCL 4 MG/2ML IJ SOLN
INTRAMUSCULAR | Status: DC | PRN
  Administered 2023-07-10: 4 mg via INTRAVENOUS

## 2023-07-10 MED ORDER — WITCH HAZEL-GLYCERIN EX PADS: 1.0000 | MEDICATED_PAD | CUTANEOUS | Status: DC | PRN

## 2023-07-10 MED ORDER — IBUPROFEN 600 MG PO TABS
600.0000 mg | ORAL_TABLET | Freq: Four times a day (QID) | ORAL | Status: DC
  Administered 2023-07-11 – 2023-07-13 (×7): 600 mg via ORAL
  Filled 2023-07-10 (×8): qty 1

## 2023-07-10 MED ORDER — KETOROLAC TROMETHAMINE 30 MG/ML IJ SOLN
INTRAMUSCULAR | Status: AC
  Filled 2023-07-10: qty 1

## 2023-07-10 MED ORDER — HYDROMORPHONE HCL 1 MG/ML IJ SOLN: 0.2500 mg | INTRAMUSCULAR | Status: DC | PRN

## 2023-07-10 MED ORDER — DEXAMETHASONE SODIUM PHOSPHATE 10 MG/ML IJ SOLN
INTRAMUSCULAR | Status: DC | PRN
  Administered 2023-07-10: 4 mg via INTRAVENOUS

## 2023-07-10 MED ORDER — MORPHINE SULFATE (PF) 0.5 MG/ML IJ SOLN
INTRAMUSCULAR | Status: AC
  Filled 2023-07-10: qty 10

## 2023-07-10 MED ORDER — CALCIUM CARBONATE ANTACID 500 MG PO CHEW: 2.0000 | CHEWABLE_TABLET | Freq: Once | ORAL | Status: DC

## 2023-07-10 MED ORDER — ACETAMINOPHEN 500 MG PO TABS
1000.0000 mg | ORAL_TABLET | Freq: Four times a day (QID) | ORAL | Status: DC
  Administered 2023-07-11 – 2023-07-13 (×8): 1000 mg via ORAL
  Filled 2023-07-10 (×12): qty 2

## 2023-07-10 MED ORDER — ACETAMINOPHEN 10 MG/ML IV SOLN
INTRAVENOUS | Status: DC | PRN
  Administered 2023-07-10: 1000 mg via INTRAVENOUS

## 2023-07-10 MED ORDER — DIBUCAINE (PERIANAL) 1 % EX OINT: 1.0000 | TOPICAL_OINTMENT | CUTANEOUS | Status: DC | PRN

## 2023-07-10 MED ORDER — SODIUM CHLORIDE 0.9 % IV SOLN
500.0000 mg | INTRAVENOUS | Status: AC
  Administered 2023-07-10: 500 mg via INTRAVENOUS

## 2023-07-10 MED ORDER — DEXAMETHASONE SODIUM PHOSPHATE 4 MG/ML IJ SOLN
INTRAMUSCULAR | Status: AC
  Filled 2023-07-10: qty 1

## 2023-07-10 MED ORDER — FENTANYL CITRATE (PF) 100 MCG/2ML IJ SOLN
INTRAMUSCULAR | Status: DC | PRN
  Administered 2023-07-10: 50 ug via INTRAVENOUS

## 2023-07-10 MED ORDER — PHENYLEPHRINE HCL (PRESSORS) 10 MG/ML IV SOLN
INTRAVENOUS | Status: DC | PRN
  Administered 2023-07-10: 80 ug via INTRAVENOUS
  Administered 2023-07-10: 180 ug via INTRAVENOUS
  Administered 2023-07-10: 80 ug via INTRAVENOUS

## 2023-07-10 MED ORDER — SOD CITRATE-CITRIC ACID 500-334 MG/5ML PO SOLN
30.0000 mL | ORAL | Status: DC
Start: 2023-07-10 — End: 2023-07-10

## 2023-07-10 MED ORDER — PROPOFOL 500 MG/50ML IV EMUL
INTRAVENOUS | Status: AC
  Filled 2023-07-10: qty 50

## 2023-07-10 MED ORDER — SODIUM CHLORIDE 0.9 % IV SOLN
INTRAVENOUS | Status: AC
  Filled 2023-07-10: qty 5

## 2023-07-10 MED ORDER — STERILE WATER FOR IRRIGATION IR SOLN
Status: DC | PRN
  Administered 2023-07-10: 1

## 2023-07-10 MED ORDER — SIMETHICONE 80 MG PO CHEW
80.0000 mg | CHEWABLE_TABLET | ORAL | Status: DC | PRN
  Administered 2023-07-12: 80 mg via ORAL
  Filled 2023-07-10: qty 1

## 2023-07-10 MED ORDER — KETOROLAC TROMETHAMINE 30 MG/ML IJ SOLN
30.0000 mg | Freq: Four times a day (QID) | INTRAMUSCULAR | Status: AC
  Administered 2023-07-11: 30 mg via INTRAVENOUS
  Filled 2023-07-10 (×4): qty 1

## 2023-07-10 MED ORDER — HYDROMORPHONE HCL 1 MG/ML IJ SOLN
INTRAMUSCULAR | Status: AC
  Filled 2023-07-10: qty 0.5

## 2023-07-10 MED ORDER — CHLOROPROCAINE HCL (PF) 3 % IJ SOLN
INTRAMUSCULAR | Status: DC | PRN
  Administered 2023-07-10 (×4): 5 mL

## 2023-07-10 MED ORDER — OXYCODONE HCL 5 MG PO TABS
ORAL_TABLET | ORAL | Status: AC
  Filled 2023-07-10: qty 1

## 2023-07-10 MED ORDER — SIMETHICONE 80 MG PO CHEW
80.0000 mg | CHEWABLE_TABLET | Freq: Three times a day (TID) | ORAL | Status: DC
  Administered 2023-07-11 – 2023-07-13 (×7): 80 mg via ORAL
  Filled 2023-07-10 (×9): qty 1

## 2023-07-10 MED ORDER — MORPHINE SULFATE (PF) 2 MG/ML IV SOLN: 1.0000 mg | INTRAVENOUS | Status: DC | PRN

## 2023-07-10 MED ORDER — KETOROLAC TROMETHAMINE 30 MG/ML IJ SOLN: 15.0000 mg | Freq: Once | INTRAMUSCULAR | Status: AC

## 2023-07-10 MED ORDER — MEPERIDINE HCL 25 MG/ML IJ SOLN: 6.2500 mg | INTRAMUSCULAR | Status: DC | PRN

## 2023-07-10 MED ORDER — SENNOSIDES-DOCUSATE SODIUM 8.6-50 MG PO TABS
2.0000 | ORAL_TABLET | Freq: Every day | ORAL | Status: DC
  Administered 2023-07-11: 2 via ORAL
  Filled 2023-07-10: qty 2

## 2023-07-10 MED ORDER — HYDROCODONE-ACETAMINOPHEN 7.5-325 MG PO TABS: 1.0000 | ORAL_TABLET | Freq: Once | ORAL | Status: DC | PRN

## 2023-07-10 MED ORDER — SODIUM CHLORIDE 0.9 % IR SOLN
Status: DC | PRN
  Administered 2023-07-10: 1000 mL

## 2023-07-10 MED ORDER — MEASLES, MUMPS & RUBELLA VAC IJ SOLR
0.5000 mL | Freq: Once | INTRAMUSCULAR | Status: AC
  Administered 2023-07-13: 0.5 mL via SUBCUTANEOUS
  Filled 2023-07-10: qty 0.5

## 2023-07-10 MED ORDER — TRANEXAMIC ACID-NACL 1000-0.7 MG/100ML-% IV SOLN
INTRAVENOUS | Status: AC
  Filled 2023-07-10: qty 100

## 2023-07-10 MED ORDER — SUCCINYLCHOLINE CHLORIDE 200 MG/10ML IV SOSY
PREFILLED_SYRINGE | INTRAVENOUS | Status: DC | PRN
  Administered 2023-07-10: 120 mg via INTRAVENOUS

## 2023-07-10 MED ORDER — SODIUM CHLORIDE 0.9 % IV SOLN: 12.5000 mg | INTRAVENOUS | Status: DC | PRN

## 2023-07-10 MED ORDER — ZOLPIDEM TARTRATE 5 MG PO TABS: 5.0000 mg | ORAL_TABLET | Freq: Every evening | ORAL | Status: DC | PRN

## 2023-07-10 SURGICAL SUPPLY — 34 items
BENZOIN TINCTURE PRP APPL 2/3 (GAUZE/BANDAGES/DRESSINGS) IMPLANT
CHLORAPREP W/TINT 26 (MISCELLANEOUS) ×2 IMPLANT
CLAMP UMBILICAL CORD (MISCELLANEOUS) ×1 IMPLANT
CLOTH BEACON ORANGE TIMEOUT ST (SAFETY) ×1 IMPLANT
DERMABOND ADVANCED .7 DNX12 (GAUZE/BANDAGES/DRESSINGS) ×1 IMPLANT
DERMABOND ADVANCED .7 DNX6 (GAUZE/BANDAGES/DRESSINGS) IMPLANT
DRSG OPSITE POSTOP 4X10 (GAUZE/BANDAGES/DRESSINGS) ×1 IMPLANT
ELECTRODE REM PT RTRN 9FT ADLT (ELECTROSURGICAL) ×1 IMPLANT
EXTRACTOR VACUUM BELL STYLE (SUCTIONS) IMPLANT
GAUZE SPONGE 4X4 12PLY STRL LF (GAUZE/BANDAGES/DRESSINGS) IMPLANT
GLOVE BIOGEL PI IND STRL 6.5 (GLOVE) ×1 IMPLANT
GLOVE BIOGEL PI IND STRL 7.0 (GLOVE) ×2 IMPLANT
GLOVE SURG SS PI 6.0 STRL IVOR (GLOVE) ×1 IMPLANT
GOWN STRL REUS W/TWL LRG LVL3 (GOWN DISPOSABLE) ×1 IMPLANT
KIT ABG SYR 3ML LUER SLIP (SYRINGE) IMPLANT
LIGASURE IMPACT 36 18CM CVD LR (INSTRUMENTS) ×1 IMPLANT
MAT PREVALON FULL STRYKER (MISCELLANEOUS) IMPLANT
NDL HYPO 25X5/8 SAFETYGLIDE (NEEDLE) IMPLANT
NEEDLE HYPO 25X5/8 SAFETYGLIDE (NEEDLE) IMPLANT
NS IRRIG 1000ML POUR BTL (IV SOLUTION) ×1 IMPLANT
PACK C SECTION WH (CUSTOM PROCEDURE TRAY) ×1 IMPLANT
PAD ABD 8X10 STRL (GAUZE/BANDAGES/DRESSINGS) IMPLANT
PAD OB MATERNITY 4.3X12.25 (PERSONAL CARE ITEMS) ×1 IMPLANT
PENCIL SMOKE EVAC W/HOLSTER (ELECTROSURGICAL) IMPLANT
RTRCTR C-SECT PINK 25CM LRG (MISCELLANEOUS) IMPLANT
STRIP CLOSURE SKIN 1/2X4 (GAUZE/BANDAGES/DRESSINGS) IMPLANT
SUT PDS AB 0 CTX 36 PDP370T (SUTURE) IMPLANT
SUT PLAIN ABS 2-0 CT1 27XMFL (SUTURE) ×1 IMPLANT
SUT VIC AB 0 CTX36XBRD ANBCTRL (SUTURE) ×4 IMPLANT
SUT VIC AB 2-0 CT1 TAPERPNT 27 (SUTURE) ×2 IMPLANT
SUT VIC AB 4-0 KS 27 (SUTURE) ×1 IMPLANT
TOWEL OR 17X24 6PK STRL BLUE (TOWEL DISPOSABLE) ×2 IMPLANT
TRAY FOLEY W/BAG SLVR 14FR LF (SET/KITS/TRAYS/PACK) ×1 IMPLANT
WATER STERILE IRR 1000ML POUR (IV SOLUTION) ×1 IMPLANT

## 2023-07-10 NOTE — Lactation Note (Signed)
 This note was copied from a baby's chart. Lactation Consultation Note  Patient Name: Karen Yang [id number] Date: 07/10/2023 Age:28 hours, P2, experienced breast feeder  Reason for consult: Initial assessment;Early term 37-38.6wks;Breastfeeding assistance Per mom last fed at 1024 for 18 mins total for both sides.  Mom mentioned she so desired a VBac and ended up with a C/section due to intense pain.  LC reviewed breast feeding goals for 24 hours - feed with cues and by 3 hours check the diaper, change if needed and latch STS and offer the breast.  LC offered to check the diaper and help with breast feeding, mom receptive. LC changed a tiny wet diaper of 2 drops in the diaper. HNS.  LC placed the baby in the cross cradle and mom preferred the cross cradle position, baby was wake for short interval and back to sleep.  Baby STS with mom.  Mom's doula into visit and showed LC a picture of the baby latched this am that mom had sent her.  Per mom had been leaking during her pregnancy.  LC recommended STS and try again with cues and by 3 hours.   Maternal Data Has patient been taught Hand Expression?:  (mom is experienced and demostrated she is aware of how to hand express) Does the patient have breastfeeding experience prior to this delivery?: Yes How long did the patient breastfeed?: permom 6 months and then supply decreased  Feeding Mother's Current Feeding Choice: Breast Milk  LATCH Score Latch: Too sleepy or reluctant, no latch achieved, no sucking elicited.  Audible Swallowing: None  Type of Nipple: Everted at rest and after stimulation  Comfort (Breast/Nipple): Soft / non-tender  Hold (Positioning): No assistance needed to correctly position infant at breast.  LATCH Score: 6   Lactation Tools Discussed/Used  None needed at this point -   Interventions Interventions: Breast feeding basics reviewed;Hand express;Education;CDC milk storage guidelines;CDC Guidelines for  Breast Pump Cleaning  Discharge Pump: Personal;DEBP (Medela)  Consult Status Consult Status: Follow-up Date: 07/11/23 Follow-up type: In-patient    Karen Yang 07/10/2023, 2:21 PM

## 2023-07-10 NOTE — Anesthesia Procedure Notes (Signed)
 Procedure Name: Intubation Date/Time: 07/10/2023 3:54 AM  Performed by: Hipolito Luke, CRNAPre-anesthesia Checklist: Patient identified, Emergency Drugs available, Suction available and Patient being monitored Patient Re-evaluated:Patient Re-evaluated prior to induction Oxygen  Delivery Method: Circle system utilized Preoxygenation: Pre-oxygenation with 100% oxygen  Induction Type: IV induction and Rapid sequence Laryngoscope Size: Glidescope Grade View: Grade I Tube type: Oral Tube size: 7.0 mm Number of attempts: 1 Airway Equipment and Method: Rigid stylet Placement Confirmation: ETT inserted through vocal cords under direct vision, positive ETCO2 and breath sounds checked- equal and bilateral Secured at: 21 cm Tube secured with: Tape Dental Injury: Teeth and Oropharynx as per pre-operative assessment

## 2023-07-10 NOTE — Progress Notes (Signed)
 Erum Cercone is a 28 y.o. G2P0101 at [redacted]w[redacted]d   Objective: BP 121/68   Pulse (!) 117   Temp 98.5 F (36.9 C) (Oral)   Resp 16   Ht 5' 3 (1.6 m)   Wt 63 kg   SpO2 100%   BMI 24.59 kg/m  No intake/output data recorded. Total I/O In: -  Out: 800 [Urine:800]  FHT:  FHR: 140 bpm, variability: moderate,  accelerations:  Abscent,  decelerations:  Present rare variables UC:   regular, every 2 minutes SVE:   Dilation: 10 Effacement (%): 90 Station: 0 Exam by:: Felipe Horton MD  Labs: Lab Results  Component Value Date   WBC 11.1 (H) 07/09/2023   HGB 9.1 (L) 07/09/2023   HCT 30.7 (L) 07/09/2023   MCV 71.4 (L) 07/09/2023   PLT 274 07/09/2023    Assessment / Plan: 28 yo G2P1001 @ 37.6 IOL TOLAC for severe polyhydramnios   IOL: Pushing for 30 minutes. Good maternal effort but minimal descent. Continue pushing Fetal Wellbeing:  category II Pain Control:  Comfortable with epidural  Leanne Pronto, DO 07/10/2023, 3:19 AM

## 2023-07-10 NOTE — Transfer of Care (Signed)
 Immediate Anesthesia Transfer of Care Note  Patient: Karen Yang  Procedure(s) Performed: CESAREAN DELIVERY  Patient Location: PACU  Anesthesia Type:General  Level of Consciousness: awake, alert , and oriented  Airway & Oxygen  Therapy: Patient Spontanous Breathing  Post-op Assessment: Report given to RN and Post -op Vital signs reviewed and stable  Post vital signs: Reviewed and stable  Last Vitals:  Vitals Value Taken Time  BP 123/81 07/10/23 05:00  Temp    Pulse 93 07/10/23 05:05  Resp 17 07/10/23 05:05  SpO2 100 % 07/10/23 05:05  Vitals shown include unfiled device data.  Last Pain:  Vitals:   07/10/23 0221  TempSrc: Oral  PainSc:          Complications: No notable events documented.

## 2023-07-10 NOTE — Progress Notes (Signed)
 Patient ID: Karen Yang, female   DOB: Feb 09, 1995, 28 y.o.   MRN: [id number]  Called to bedside in PACU by RN for possible seizure. When I arrived she had just received IV dilaudid. She was awake but not alert. Her eyes were rolling from side to side without patterned nystagmus. There was no jerking of arms or legs. She was able to respond to my voice and follow commands. She was vitally stable throughout and after continued monitoring had increased alertness. She was able to express her desires for the care of her newborn.   I suspect AMS multifactorial in etiology-lack of sleep, recent narcotics.   Will continue monitoring   Last Vitals:      Vitals:    07/10/23 0515 07/10/23 0532  BP: 123/88 122/81  Pulse: (!) 103 99  Resp: 19 18  Temp: 36.8 C    SpO2: 100% 100%    Last Pain:     Vitals:    07/10/23 0635  TempSrc:    PainSc: 5

## 2023-07-10 NOTE — Op Note (Signed)
 C-Section Operative Note  Date: 07/10/23  Preoperative Diagnosis: [redacted] weeks gestation Failed trial of labor after cesarean section Concern for uterine rupture Severe polyhydramnios Large for gestation age infant Bicornuate uterus  Postoperative Diagnosis:  [redacted] weeks gestation Failed trial of labor after cesarean section Severe polyhydramnios Large for gestation age infant Septate uterus  Procedure: repeat low transverse cesarean section without extensions  Surgeon: Rossie Coon, DO Assist: L. Randolm Butte, MD    An experienced assistant was required given the standard of surgical care given the complexity of the case and maternal body habitus.  This assistant was needed for exposure, dissection, suctioning, retraction, instrument exchange, assisting with delivery with administration of fundal pressure, and for overall help during the procedure.     Operative Findings: Obstructed bladder. 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 Thin, intact lower uterine segment. Scant clear amniotic fluid. Viable female infant in cephalic position within the right uterine horn weighing 3430g with APGARS of 8 and 9 at 1 and 5 minutes, respectively. Normal fallopian tubes and ovaries bilaterally. Specimens: Placenta to pathology EBL 381 IVF 1300 UOP 900  Patient Course: G2P1001 at 38 weeks 0 days, admitted for trial of labor in the setting of severe polyhydramnios. Induced with pitocin  and AROM. After 1 hour of pushing with minimal descent despite good maternal effort, she developed acute incisional pain concerning for uterine rupture.  Consent:  R/B of cesarean section discussed with patient. Risks of cesarean section include but are not limited to infection of the uterus, pelvic organs, or skin, inadvertent injury to internal organs, such as bowel or bladder, vasculature, and nerves. If there is major injury, extensive surgery may be required. If injury is minor, it may be treated with  relative ease and repaired at the time of injury. Discussed possibility of excessive blood loss and transfusion. If bleeding cannot be controlled using medical or minor surgical methods, a cesarean hysterectomy may be performed which would mean no future fertility. Patient accepts the possibility of blood transfusion, if necessary. Patient understands and agrees to move forward with section.   Operative Procedure: Patient was taken to the operating room where her epidural was bolused. Her foley catheter was in place from her labor course and was draining yellow urine. Betadine was then splashed. Anesthesia was found to be inadequate by Allis clamp test and the decision was made to convert to general anesthesia. After induction, a Pfannenstiel skin incision was then made with the scalpel and carried through to the underlying layer of fascia by sharp dissection. The fascia was nicked in the midline and the incision was extended laterally with blunt dissection. The superior rectus fascia was dissection from the underlying muscle with blunt dissection. Rectus muscles were separated in the midline and the peritoneal cavity entered bluntly. The peritoneal incision was then extended both superiorly and inferiorly with careful attention to avoid both bowel and bladder. The Alexis self-retaining wound retractor was then placed within the incision and the lower uterine segment exposed. The bladder outlet was noted to be obstructed with large edema of the bladder dome. This was felt to be the etiology of the acute development of pain. The lower uterine segment was intact. The lower segment was then incised in a low transverse fashion and the cavity itself entered bluntly. The incision was extended bluntly. The infant's head was then lifted and delivered from the incision without difficulty using the standard movements. The remainder of the infant delivered. The cord clamped and cut  as well after 1 minute. The infant was  handed off to the waiting neonatology team. The placenta was then spontaneously expressed from the uterus. Both sides of the uterus where then cleared of all clots and debris with moist lap sponge. The uterine incision was then repaired with a running locked layer of 0-monocryl. Tranexamic acid and methergine were given for uterine atony and improved tone. The tubes and ovaries were inspected and the gutters cleared of all clots and debris. The uterine incision was inspected and found to be hemostatic. All instruments and sponges as well as the Alexis retractor were then removed from the abdomen. The rectus muscles were then reapproximated with several interrupted mattress sutures of 2-0 Vicryl. The fascia was then closed with 0 Vicryl in a running fashion. Subcutaneous tissue was reapproximated with 2-0 plain in a running fashion. The skin was closed with a subcuticular stitch of 4-0 Vicryl on a Keith needle and then reinforced with dermabond and a Honeycomb dressing. At the conclusion of the procedure all instruments and sponge counts were correct. Patient was taken to the recovery room in good condition. Her baby was in the recovery room skin-to-skin with her husband.   Karen Yang

## 2023-07-10 NOTE — Progress Notes (Signed)
 Late entry:   Karen Yang is a 28 y.o. G2P0101 at [redacted]w[redacted]d  Pushing for 1 hour   Called back to room by RN during pushing. Patient c/o new onset severe incisional pain, constant not relieved between CTX  Objective: BP 121/68   Pulse (!) 117   Temp 98.5 F (36.9 C) (Oral)   Resp 16   Ht 5' 3 (1.6 m)   Wt 63 kg   SpO2 100%   BMI 24.59 kg/m  No intake/output data recorded. Total I/O In: -  Out: 800 [Urine:800]   FHT:  FHR: 140 bpm, variability: moderate,  accelerations:  Abscent,  decelerations:  Present variable UC:   regular, every 2 minutes SVE:   Dilation: 10 Effacement (%): 90 Station: 0 Exam by:: Sonita Michiels MD   Assessment / Plan: 28 yo G2P1001 @ 38.0 IOL TOLAC for severe polyhydramnios   Given onset of new-onset pain, stat c-section called for concern of uterine rupture  Patient counseled on risks. Risks including but not limited to bleeding, infection, injury to nearby structures (bowel, bladder, vasculature, nerves, baby), need for blood transfusion, and possible hysterectomy. Expresses understanding and consents to proceeding  Leanne Pronto, DO 07/10/2023, 3:43 AM

## 2023-07-10 NOTE — Anesthesia Postprocedure Evaluation (Signed)
 Anesthesia Post Note  Patient: Karen Yang  Procedure(s) Performed: CESAREAN DELIVERY     Patient location during evaluation: PACU Anesthesia Type: Epidural and General Level of consciousness: awake and alert Pain management: pain level controlled Vital Signs Assessment: post-procedure vital signs reviewed and stable Respiratory status: spontaneous breathing, nonlabored ventilation, respiratory function stable and patient connected to nasal cannula oxygen  Cardiovascular status: blood pressure returned to baseline and stable Postop Assessment: no apparent nausea or vomiting Anesthetic complications: no Comments: Episode of abrupt mental status change in PACU, uncertain etiology. Evaluated by myself and OBGYN attending. PACU RN report shortly after receiving IV hydromorphone, Karen Yang demonstrated eyes rolling back and was temporarily unresponsive to voice or stimulation. When I arrived at bedside she was awake, speaking, and following commands. She did not appear to be postictal. Her spouse reports no history of seizure disorder. Her vital signs were stable throughout. She has some residual lower extremity weakness from her prior epidural but no other focal neurologic signs. I recommended continued close monitoring.    No notable events documented.  Last Vitals:  Vitals:   07/10/23 0515 07/10/23 0532  BP: 123/88 122/81  Pulse: (!) 103 99  Resp: 19 18  Temp: 36.8 C   SpO2: 100% 100%    Last Pain:  Vitals:   07/10/23 0635  TempSrc:   PainSc: 5    Pain Goal:                   Leslye Rast

## 2023-07-11 LAB — CBC
HCT: 31.8 % — ABNORMAL LOW (ref 36.0–46.0)
Hemoglobin: 9.8 g/dL — ABNORMAL LOW (ref 12.0–15.0)
MCH: 24.2 pg — ABNORMAL LOW (ref 26.0–34.0)
MCHC: 30.8 g/dL (ref 30.0–36.0)
MCV: 78.5 fL — ABNORMAL LOW (ref 80.0–100.0)
Platelets: 214 10*3/uL (ref 150–400)
RBC: 4.05 MIL/uL (ref 3.87–5.11)
RDW: 23.5 % — ABNORMAL HIGH (ref 11.5–15.5)
WBC: 15.7 10*3/uL — ABNORMAL HIGH (ref 4.0–10.5)
nRBC: 0 % (ref 0.0–0.2)

## 2023-07-11 LAB — PREPARE RBC (CROSSMATCH)

## 2023-07-11 MED ORDER — SODIUM CHLORIDE 0.9% IV SOLUTION: INTRAVENOUS | Status: DC

## 2023-07-11 MED ORDER — DOCUSATE SODIUM 100 MG PO CAPS
100.0000 mg | ORAL_CAPSULE | Freq: Two times a day (BID) | ORAL | Status: DC
  Administered 2023-07-12 – 2023-07-13 (×3): 100 mg via ORAL
  Filled 2023-07-11 (×4): qty 1

## 2023-07-11 NOTE — Progress Notes (Addendum)
 POSTPARTUM POSTOP PROGRESS NOTE  POD #1  Subjective:  No acute events overnight. Baby working on latch and continued feeding, desires to see lactation. Patient feels very fatigued this morning, admits she things she looks pale. OK ambulating to bathroom but feels to tired to ambulate in hall. Reviewed starting Hgb 9.1 down to 7.9 with symptoms. Unable to tolerate PO iron  in pregnancy, only received one IV iron  infusion. Amenable to RBC transfusion as she is only POD#1. She has had flatus. She has not had bowel movement.  Lochia Minimal.   Objective: Blood pressure (!) 104/54, pulse 83, temperature 98.6 F (37 C), temperature source Oral, resp. rate 18, height 5' 3 (1.6 m), weight 63 kg, SpO2 98%, unknown if currently breastfeeding.  Physical Exam:  General: alert, cooperative and no distress. Appears pale, fatigued. Lochia:normal flow Chest: CTAB Heart: RRR no m/r/g Abdomen: +BS, soft, nontender Uterine Fundus: firm, 2cm below umbilicus. Honeycomb dressing intact, neg drainage Extremities: neg edema, neg calf TTP BL, neg Homans BL  Recent Labs    07/09/23 0041 07/10/23 0834  HGB 9.1* 7.9*  HCT 30.7* 26.9*    Assessment/Plan:  ASSESSMENT: Karen Yang is a 28 y.o. [id number] s/p stat RLTCS @ [redacted]w[redacted]d for failed TOLAC with acute incision pain during second stage. PNC c/b h/o csx x1, severe polyhydramnios, septate uterus, iron  deficiency anemia, mild intermittent asthma.   Plan for discharge tomorrow, Breastfeeding, Lactation consult, and Circumcision prior to discharge ABLA - clinically significant: 9.1 > 7.9, pt symptomatic. Plan 2u pRBC transfusion, monitor. Patient agrees. Circ deferred to work on feeding Pain control - pt declining opiods, on gabapentin/ibuprofen /tylenol   LOS: 2 days

## 2023-07-11 NOTE — Lactation Note (Signed)
 This note was copied from a baby's chart. Lactation Consultation Note  Patient Name: Karen Yang ZOXWR'U Date: 07/11/2023 Age:28 hours Reason for consult: Follow-up assessment;Early term 37-38.6wks  P2- MOB reports that she has started DBM today because she does not know if she is making enough colostrum for infant at this time. MOB had a 481 blood loss, but still received 2 L of blood (per MOB). MOB reports feeling better than this morning, but still not 100%. MOB had not been set up with a manual pump or DEBP, so LC reviewed the importance of using one at this time for extra stimulation. MOB declined the manual pump and the hospital DEBP. LC encouraged MOB to always offer the breast first, then offer the DBM after as supplementation. MOB confirms that this is what she has been doing so far. LC praised MOB. MOB denies having any questions or concerns. LC encouraged MOB to to call out for a latch assessment and to set up the DEBP (if she changes her mind).  Maternal Data Has patient been taught Hand Expression?: Yes Does the patient have breastfeeding experience prior to this delivery?: Yes  Feeding Mother's Current Feeding Choice: Breast Milk and Donor Milk  Lactation Tools Discussed/Used Pump Education: Milk Storage  Interventions Interventions: Breast feeding basics reviewed;Education;LC Services brochure  Discharge Discharge Education: Engorgement and breast care;Warning signs for feeding baby Pump: DEBP;Personal  Consult Status Consult Status: Follow-up Date: 07/12/23 Follow-up type: In-patient    Vernette Goo BS, IBCLC 07/11/2023, 4:25 PM

## 2023-07-12 ENCOUNTER — Non-Acute Institutional Stay (HOSPITAL_COMMUNITY)

## 2023-07-12 LAB — TYPE AND SCREEN
ABO/RH(D): O POS
Antibody Screen: NEGATIVE
Unit division: 0
Unit division: 0

## 2023-07-12 LAB — BPAM RBC
Blood Product Expiration Date: 202507112359
Blood Product Expiration Date: 202507112359
ISSUE DATE / TIME: 202506151103
ISSUE DATE / TIME: 202506151334
Unit Type and Rh: 5100
Unit Type and Rh: 5100

## 2023-07-12 NOTE — Progress Notes (Signed)
 POSTPARTUM POSTOP PROGRESS NOTE  POD #2  Subjective:  No acute events overnight. Feeling much better in terms of her energy level. She is not in much pain at baseline, but reports 8/10 fundal pain with palpation. Denies foul vaginal discharge or chills. Ambulating, voiding without issue. Tolerating PO. She has had flatus and a bowel movement.  Lochia Minimal. Baby working on latch and continued feeding.   Objective:    07/12/2023    1:45 PM 07/12/2023    4:33 AM 07/11/2023    7:43 PM  Vitals with BMI  Systolic 112 116 409  Diastolic 80 76 75  Pulse 88 93 81     Physical Exam:  General: alert, cooperative and no distress. Resp: normal WOB Cards: well perfused Abdomen: soft, nontender Uterine Fundus: firm, moderately tender to palpation, 2cm below umbilicus. Honeycomb dressing intact, neg drainage Extremities: neg edema, neg calf TTP BL, neg Homans BL  Recent Labs    07/10/23 0834 07/11/23 2032  HGB 7.9* 9.8*  HCT 26.9* 31.8*    Assessment/Plan: Karen Yang is a 28 y.o. [id number] s/p stat RLTCS @ [redacted]w[redacted]d for failed TOLAC with acute incision pain during second stage. PNC c/b h/o csx x1, severe polyhydramnios, septate uterus, iron  deficiency anemia, mild intermittent asthma.  ABLA - clinically significant for this hospitalization: 9.1 > 7.9>2u pRBC transfusion> 9.8. Symptoms resolved. Plan PO iron  at home. Patient reports she can tolerate PO iron  if not taking GERD meds needed during pregnancy Fundal tenderness: In setting of STAT delivery. 8/10 fundal tenderness, but with no other s/sx of endometritis. Will continue to monitor inpatient and start abx if patient develops other symptoms or fever.  Plan for discharge tomorrow, Breastfeeding, Lactation consult, and declines circumcision.  LOS: 3 days

## 2023-07-13 LAB — SURGICAL PATHOLOGY

## 2023-07-13 MED ORDER — ACETAMINOPHEN 500 MG PO TABS: 1000.0000 mg | ORAL_TABLET | Freq: Three times a day (TID) | ORAL | 0 refills | Status: AC

## 2023-07-13 MED ORDER — IBUPROFEN 600 MG PO TABS: 600.0000 mg | ORAL_TABLET | Freq: Four times a day (QID) | ORAL | 0 refills | Status: AC

## 2023-07-13 MED ORDER — GABAPENTIN 100 MG PO CAPS: 100.0000 mg | ORAL_CAPSULE | Freq: Two times a day (BID) | ORAL | 0 refills | Status: AC

## 2023-07-13 NOTE — Discharge Summary (Signed)
 Postpartum Discharge Summary  Date of Service updated     Patient Name: Karen Yang DOB: March 15, 1995 MRN: [id number]  Date of admission: 07/09/2023 Delivery date:07/10/2023 Delivering provider: Jeneane Miracle A Date of discharge: 07/13/2023  Admitting diagnosis: Polyhydramnios affecting pregnancy in third trimester [O40.3XX0] Intrauterine pregnancy: [redacted]w[redacted]d     Secondary diagnosis:  Principal Problem:   Polyhydramnios affecting pregnancy in third trimester  Additional problems: LGA fetus, septate uterus, trial of labor after cesarean, iron  deficiency anemia    Discharge diagnosis: Failed TOLAC, severe polyhydramnios, iron  deficiency anemia, LGA fetus                                        Post partum procedures:blood transfusion Augmentation: AROM and Pitocin  Complications: None  Hospital course: Induction of Labor With Cesarean Section   28 y.o. yo G2P1102 at [redacted]w[redacted]d was admitted to the hospital 07/09/2023 for induction of labor for severe polyhydramnios. Patient had prolonged active phase. She progressed to complete but complained of severe incisional pain in the second stage. The patient went for cesarean section due to failed TOLAC, concern for uterine rupture. Delivery details are as follows: Membrane Rupture Time/Date: 10:13 AM,07/09/2023  Delivery Method:C-Section, Low Transverse Operative Delivery:N/A Details of operation can be found in separate operative Note: she did not have uterine rupture but instead had acute urinary obstruction due to the fetal head. Patient had a postpartum course complicated by acute on chronic anemia requiring blood transfusion. She is ambulating, tolerating a regular diet, passing flatus, and urinating well.  Patient is discharged home in stable condition on 07/13/23.      Newborn Data: Birth date:07/10/2023 Birth time:3:56 AM Gender:Female Living status:Living Apgars:8 ,9  Weight:3430 g                               Magnesium  Sulfate received:  No BMZ received: No Rhophylac:No NFA:OZHYQMV, not given T-DaP:Given prenatally Flu: No RSV Vaccine received: No Transfusion:Yes Immunizations administered: Immunization History  Administered Date(s) Administered   Tdap 01/31/2019    Physical exam  Vitals:   07/12/23 0433 07/12/23 1345 07/12/23 2000 07/13/23 0532  BP: 116/76 112/80 118/65 110/70  Pulse: 93 88 86 60  Resp: 16 16 18    Temp: (!) 97.5 F (36.4 C) 98 F (36.7 C) 98.2 F (36.8 C) 98 F (36.7 C)  TempSrc: Oral Oral Oral Oral  SpO2: 97% 98% 99% 99%  Weight:      Height:       General: alert, cooperative, and no distress Lochia: appropriate Uterine Fundus: firm, 3 cm inferior to umbilicus, and tender Abdomen: tender at umbilicus Incision: Dressing is clean, dry, and intact DVT Evaluation: No evidence of DVT seen on physical exam. No cords or calf tenderness. Labs: Lab Results  Component Value Date   WBC 15.7 (H) 07/11/2023   HGB 9.8 (L) 07/11/2023   HCT 31.8 (L) 07/11/2023   MCV 78.5 (L) 07/11/2023   PLT 214 07/11/2023      Latest Ref Rng & Units 06/27/2023    9:10 PM  CMP  Glucose 70 - 99 mg/dL 85   BUN 6 - 20 mg/dL 8   Creatinine 7.84 - 6.96 mg/dL 2.95   Sodium 284 - 132 mmol/L 134   Potassium 3.5 - 5.1 mmol/L 3.5   Chloride 98 - 111 mmol/L 104  CO2 22 - 32 mmol/L 20   Calcium  8.9 - 10.3 mg/dL 8.9   Total Protein 6.5 - 8.1 g/dL 6.4   Total Bilirubin 0.0 - 1.2 mg/dL 0.6   Alkaline Phos 38 - 126 U/L 156   AST 15 - 41 U/L 25   ALT 0 - 44 U/L 13    Edinburgh Score:    07/11/2023    2:02 PM  Edinburgh Postnatal Depression Scale Screening Tool  I have been able to laugh and see the funny side of things. 0  I have looked forward with enjoyment to things. 0  I have blamed myself unnecessarily when things went wrong. 1  I have been anxious or worried for no good reason. 0  I have felt scared or panicky for no good reason. 0  Things have been getting on top of me. 1  I have been so unhappy that  I have had difficulty sleeping. 0  I have felt sad or miserable. 0  I have been so unhappy that I have been crying. 0  The thought of harming myself has occurred to me. 0  Edinburgh Postnatal Depression Scale Total 2      After visit meds:  Allergies as of 07/13/2023   No Known Allergies      Medication List     TAKE these medications    acetaminophen  500 MG tablet Commonly known as: TYLENOL  Take 2 tablets (1,000 mg total) by mouth 3 (three) times daily.   gabapentin 100 MG capsule Commonly known as: NEURONTIN Take 1 capsule (100 mg total) by mouth 2 (two) times daily.   ibuprofen  600 MG tablet Commonly known as: ADVIL  Take 1 tablet (600 mg total) by mouth every 6 (six) hours.   prenatal multivitamin Tabs tablet Take 1 tablet by mouth daily at 12 noon.               Discharge Care Instructions  (From admission, onward)           Start     Ordered   07/13/23 0000  Discharge wound care:       Comments: Remove on date marked on bandage   07/13/23 0920             Discharge home in stable condition Infant Feeding: Breast Infant Disposition:home with mother Discharge instruction: per After Visit Summary and Postpartum booklet. Activity: Advance as tolerated. Pelvic rest for 6 weeks.  Diet: routine diet Anticipated Birth Control: Unsure Postpartum Appointment:6 weeks Additional Postpartum F/U: Postpartum Depression checkup and Incision check 1 week Future Appointments:No future appointments. Follow up Visit:  Follow-up Information     Leanne Pronto, DO. Schedule an appointment as soon as possible for a visit.   Specialty: Obstetrics and Gynecology Contact information: [address], [place] [address] [place] [address] [phone]                     07/13/2023 Leanne Pronto, DO

## 2023-07-13 NOTE — Discharge Instructions (Signed)
 Call office with any concerns [phone]

## 2023-07-13 NOTE — Lactation Note (Signed)
 This note was copied from a baby's chart. Lactation Consultation Note  Patient Name: Karen Yang Date: 07/13/2023 Age:28 hours  LC attempted to see family but MOB and infant asleep at this time. LC services will follow up with family in the morning.    Maternal Data    Feeding    LATCH Score                    Lactation Tools Discussed/Used    Interventions    Discharge    Consult Status      [name] 07/13/2023, 2:51 AM

## 2023-07-13 NOTE — Progress Notes (Signed)
 Subjective: Postpartum Day 3: Cesarean Delivery Lochia normal and non-malodorous. Tolerating PO. Voiding without difficulty. Gas pain, abdominal pain surrounding umbilicus, and incisional pain. Prefers to avoid narcotics. Breastfeeding well  Objective: Vital signs in last 24 hours: Temp:  [98 F (36.7 C)-98.2 F (36.8 C)] 98 F (36.7 C) (06/17 0532) Pulse Rate:  [60-88] 60 (06/17 0532) Resp:  [16-18] 18 (06/16 2000) BP: (110-118)/(65-80) 110/70 (06/17 0532) SpO2:  [98 %-99 %] 99 % (06/17 0532)  Physical Exam:  General: alert, cooperative, and no distress Lochia: appropriate Uterine Fundus: firm. 3cm inferior to umbilicus, tender Abdomen: umbilicus tender Incision: Honeycomb in place, no strikethrough DVT Evaluation: No evidence of DVT seen on physical exam. No significant calf/ankle edema.  Recent Labs    07/11/23 2032  HGB 9.8*  HCT 31.8*    Assessment/Plan: 28 yo G2P2 POD3 stat RCS @ 38.0 for failed TOLAC and acute incisional pain during second stage - PP: Suspect abdominal pain related to MSK pain, low suspicion for endometritis. Prefers to avoid meds other than NSAIDs/tylenol  - Rubella non-immune: MMR ordered - Acute on chronic blood loss anemia: s/p 2u pRBCs. Hb 9.8. DC with PO iron  - Dispo: DC home   Leanne Pronto, DO 07/13/2023, 9:10 AM

## 2023-07-13 NOTE — Lactation Note (Signed)
 This note was copied from a baby's chart. Lactation Consultation Note  Patient Name: Karen Yang ZOXWR'U Date: 07/13/2023 Age:28 days Reason for consult: Follow-up assessment;Early term 37-38.6wks  P2, Breastfeeding has improved.   Reviewed engorgement care and monitoring voids/stools. Family does not have questions or concerns at this time.] Baby sleeping on father's chest, mother resting.   Maternal Data Has patient been taught Hand Expression?: Yes  Feeding Mother's Current Feeding Choice: Breast Milk  Interventions Interventions: Education  Discharge Discharge Education: Warning signs for feeding baby;Engorgement and breast care  Consult Status Consult Status: Complete Date: 07/13/23   Luellen Sages  RN, IBCLC 07/13/2023, 1:11 PM

## 2023-07-19 ENCOUNTER — Telehealth (HOSPITAL_COMMUNITY): Payer: Self-pay | Admitting: *Deleted

## 2023-07-19 NOTE — Telephone Encounter (Signed)
 07/19/2023  Name: Karen Yang MRN: [id number] DOB: 1995/05/07  Reason for Call:  Transition of Care Hospital Discharge Call  Contact Status: Patient Contact Status: Complete  Language assistant needed:          Follow-Up Questions: Do You Have Any Concerns About Your Health As You Heal From Delivery?: No Do You Have Any Concerns About Your Infants Health?: No  Edinburgh Postnatal Depression Scale:  In the Past 7 Days:  EPDS declined at this time. Patient stated, I completed it today with my OB. Patient reported that she was seen for an incision check. Reported no areas of concern identified with EPDS at her appointment.  PHQ2-9 Depression Scale:     Discharge Follow-up: Edinburgh score requires follow up?: N/A Patient was advised of the following resources:: Breastfeeding Support Group, Support Group  Post-discharge interventions: Reviewed Newborn Safe Sleep Practices  Signature Allean IVAR Carton, RN, 07/19/23, [phone]

## 2024-01-04 ENCOUNTER — Emergency Department (HOSPITAL_COMMUNITY)
Admission: EM | Admit: 2024-01-04 | Discharge: 2024-01-04 | Disposition: A | Discharge status: Home / Self Care | Attending: Emergency Medicine | Admitting: Emergency Medicine

## 2024-01-04 ENCOUNTER — Other Ambulatory Visit: Payer: Self-pay

## 2024-01-04 ENCOUNTER — Emergency Department (HOSPITAL_COMMUNITY)

## 2024-01-04 DIAGNOSIS — R1024 Suprapubic pain: Secondary | ICD-10-CM

## 2024-01-04 LAB — URINALYSIS, ROUTINE W REFLEX MICROSCOPIC
Bilirubin Urine: NEGATIVE
Glucose, UA: NEGATIVE mg/dL
Hgb urine dipstick: NEGATIVE
Ketones, ur: NEGATIVE mg/dL
Nitrite: NEGATIVE
Protein, ur: NEGATIVE mg/dL
Specific Gravity, Urine: 1.013 (ref 1.005–1.030)
pH: 5 (ref 5.0–8.0)

## 2024-01-04 LAB — COMPREHENSIVE METABOLIC PANEL WITH GFR
ALT: 22 U/L (ref 0–44)
AST: 18 U/L (ref 15–41)
Albumin: 4.1 g/dL (ref 3.5–5.0)
Alkaline Phosphatase: 113 U/L (ref 38–126)
Anion gap: 10 (ref 5–15)
BUN: 13 mg/dL (ref 6–20)
CO2: 22 mmol/L (ref 22–32)
Calcium: 9.1 mg/dL (ref 8.9–10.3)
Chloride: 106 mmol/L (ref 98–111)
Creatinine, Ser: 0.55 mg/dL (ref 0.44–1.00)
GFR, Estimated: >60 mL/min (ref >60)
Glucose, Bld: 91 mg/dL (ref 70–99)
Potassium: 4 mmol/L (ref 3.5–5.1)
Sodium: 138 mmol/L (ref 135–145)
Total Bilirubin: 0.8 mg/dL (ref 0.0–1.2)
Total Protein: 8 g/dL (ref 6.5–8.1)

## 2024-01-04 LAB — CBC
HCT: 44.9 % (ref 36.0–46.0)
Hemoglobin: 15 g/dL (ref 12.0–15.0)
MCH: 28.6 pg (ref 26.0–34.0)
MCHC: 33.4 g/dL (ref 30.0–36.0)
MCV: 85.7 fL (ref 80.0–100.0)
Platelets: 307 K/uL (ref 150–400)
RBC: 5.24 MIL/uL — ABNORMAL HIGH (ref 3.87–5.11)
RDW: 12.2 % (ref 11.5–15.5)
WBC: 13.6 K/uL — ABNORMAL HIGH (ref 4.0–10.5)
nRBC: 0 % (ref 0.0–0.2)

## 2024-01-04 LAB — HCG, SERUM, QUALITATIVE: Preg, Serum: NEGATIVE

## 2024-01-04 LAB — LIPASE, BLOOD: Lipase: 27 U/L (ref 11–51)

## 2024-01-04 MED ORDER — IOHEXOL 350 MG/ML SOLN
60.0000 mL | Freq: Once | INTRAVENOUS | Status: AC | PRN
  Administered 2024-01-04: 60 mL via INTRAVENOUS

## 2024-01-04 NOTE — ED Provider Notes (Signed)
 [facility] AT [facility] Provider Note   CSN: [id number] Arrival date & time: 01/04/24  9948     Patient presents with: Abdominal Pain   Raley Jordan Phillips Brodrick is a 28 y.o. female.   The history is provided by the patient and medical records.  Abdominal Pain  28 year old female with history of asthma, anemia, presenting to the ED for lower abdominal pain.  Began around 9:00 this morning, has been steady throughout the day.  Pain along lower portion of her prior C-section incision.  She has had some issues with this over the past few months but never this bad.  Also having pain in her right side as well.  Had some nausea earlier today without any vomiting or diarrhea.  She is not currently having menstrual cycles as she is still exclusively breast-feeding her son.  She denies any other prior abdominal surgeries aside from C-section x 2.  Denies any urinary symptoms, pelvic pain or vaginal discharge, fever, chills.  Prior to Admission medications   Medication Sig Start Date End Date Taking? Authorizing Provider  acetaminophen  (TYLENOL ) 500 MG tablet Take 2 tablets (1,000 mg total) by mouth 3 (three) times daily. 07/13/23   Claudene Mort A, DO  gabapentin  (NEURONTIN ) 100 MG capsule Take 1 capsule (100 mg total) by mouth 2 (two) times daily. 07/13/23   Claudene Mort A, DO  ibuprofen  (ADVIL ) 600 MG tablet Take 1 tablet (600 mg total) by mouth every 6 (six) hours. 07/13/23   Claudene Mort LABOR, DO  Prenatal Vit-Fe Fumarate-FA (PRENATAL MULTIVITAMIN) TABS tablet Take 1 tablet by mouth daily at 12 noon.    [provider]    Allergies: Patient has no known allergies.    Review of Systems  Gastrointestinal:  Positive for abdominal pain.  All other systems reviewed and are negative.   Updated Vital Signs BP (!) 156/96   Pulse (!) 103   Temp 97.6 F (36.4 C)   Resp 16   SpO2 100%   Physical Exam Vitals and nursing note reviewed.   Constitutional:      Appearance: She is well-developed.  HENT:     Head: Normocephalic and atraumatic.  Eyes:     Conjunctiva/sclera: Conjunctivae normal.     Pupils: Pupils are equal, round, and reactive to light.  Cardiovascular:     Rate and Rhythm: Normal rate and regular rhythm.     Heart sounds: Normal heart sounds.  Pulmonary:     Effort: Pulmonary effort is normal.     Breath sounds: Normal breath sounds.  Abdominal:     General: Bowel sounds are normal.     Palpations: Abdomen is soft.     Tenderness: There is abdominal tenderness in the right lower quadrant and suprapubic area.  Musculoskeletal:        General: Normal range of motion.     Cervical back: Normal range of motion.  Skin:    General: Skin is warm and dry.  Neurological:     Mental Status: She is alert and oriented to person, place, and time.     (all labs ordered are listed, but only abnormal results are displayed) Labs Reviewed  CBC - Abnormal; Notable for the following components:      Result Value   WBC 13.6 (*)    RBC 5.24 (*)    All other components within normal limits  URINALYSIS, ROUTINE W REFLEX MICROSCOPIC - Abnormal; Notable for the following components:   Leukocytes,Ua MODERATE (*)  Bacteria, UA RARE (*)    All other components within normal limits  URINE CULTURE  LIPASE, BLOOD  COMPREHENSIVE METABOLIC PANEL WITH GFR  HCG, SERUM, QUALITATIVE    EKG: None  Radiology: CT ABDOMEN PELVIS W CONTRAST Result Date: 01/04/2024 EXAM: CT ABDOMEN AND PELVIS WITH CONTRAST 01/04/2024 04:12:40 AM TECHNIQUE: CT of the abdomen and pelvis was performed with the administration of 60 mL of iohexol  (OMNIPAQUE ) 350 MG/ML injection. Multiplanar reformatted images are provided for review. Automated exposure control, iterative reconstruction, and/or weight-based adjustment of the mA/kV was utilized to reduce the radiation dose to as low as reasonably achievable. COMPARISON: None available. CLINICAL  HISTORY: RLQ abdominal pain. FINDINGS: LOWER CHEST: No acute abnormality. LIVER: The liver is unremarkable. GALLBLADDER AND BILE DUCTS: Gallbladder is unremarkable. No biliary ductal dilatation. SPLEEN: No acute abnormality. PANCREAS: No acute abnormality. ADRENAL GLANDS: No acute abnormality. KIDNEYS, URETERS AND BLADDER: No stones in the kidneys or ureters. No hydronephrosis. No perinephric or periureteral stranding. Urinary bladder is unremarkable. GI AND BOWEL: Appendix normal. The stomach, small bowel, and large bowel are otherwise unremarkable. There is no bowel obstruction. PERITONEUM AND RETROPERITONEUM: No ascites. No free air. VASCULATURE: Aorta is normal in caliber. LYMPH NODES: No lymphadenopathy. REPRODUCTIVE ORGANS: No acute abnormality. 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 No focal soft tissue abnormality. IMPRESSION: 1. No acute findings in the abdomen or pelvis. Normal appendix 2. Bicornuate uterus. Electronically signed by: Dorethia Molt MD 01/04/2024 04:27 AM EST RP Workstation: [id number]     Procedures   Medications Ordered in the ED  iohexol  (OMNIPAQUE ) 350 MG/ML injection 60 mL (60 mLs Intravenous Contrast Given 01/04/24 0413)                                    Medical Decision Making Amount and/or Complexity of Data Reviewed Labs: ordered. Radiology: ordered and independent interpretation performed. ECG/medicine tests: ordered and independent interpretation performed.  Risk Prescription drug management.   28 year old female presenting to the ED with lower abdominal pain.  Has been having some ongoing issues with pain along her C-section scar, has been following with OB/GYN for this and told she had scar tissue along the right side of her incision.  Pain was worse today.  Some nausea without vomiting.  He is afebrile and nontoxic in appearance here.  Some tenderness along the suprapubic region and  into the right lower quadrant.  She is not peritoneal.  Labs as above--does have leukocytosis but similar to prior.  No electrolyte derangement.  UA with leuks, few bacteria.  She denies any current urinary symptoms, pelvic pain, or vaginal discharge.  Will send for culture.  CT scan without any acute findings.    Pain may be related to her scar tissue.  Will refer back to OB/GYN for ongoing management.  Can continue tylenol  and motrin -- she is breastfeeding, we discussed maximum daily dosing.  Can return here for new concerns.  Final diagnoses:  Suprapubic pain    ED Discharge Orders     None          Jarold Olam HERO, PA-C 01/04/24 0500    Midge Golas, MD 01/04/24 [phone]

## 2024-01-04 NOTE — ED Triage Notes (Signed)
 Pt c/o lower abd pain that started around 0900. Pt endorses nausea without vomiting. No diarrhea. Pt states no menstrual cycle since having c-section six months ago.

## 2024-01-04 NOTE — Discharge Instructions (Signed)
 CT scan today did not show any acute findings. Recommend close follow-up with your OB-GYN to see if this is related to your incisional scar tissue. Tylenol  or motrin  as needed for pain-- both are safe in breast feeding.  Can safely take up to 800mg  motrin  3x daily and up to 2000mg  tylenol  daily. Return here for new concerns.

## 2024-01-05 LAB — URINE CULTURE: Culture: NO GROWTH

## 2024-01-06 NOTE — Congregational Nurse Program (Signed)
°  Dept: [phone]   Congregational Nurse Program Note  Date of Encounter: 01/06/2024 Pt in need of financial assistance, as is behind on rent due to newborn being sick at birth. Assist pt to get scheduled with pcp and begin application for [employer] fund.  [name], RN, CCNP   Past Medical History: Past Medical History:  Diagnosis Date   Anemia    Asthma    Stress induced   Blood transfusion without reported diagnosis    as an infant   Dysmenorrhea     Encounter Details:  Community Questionnaire - 01/06/24 1732       Questionnaire   Ask client: Do you give verbal consent for me to treat you today? Yes    Student Assistance N/A    Location Patient Served  Pensions Consultant   by phone   Encounter Setting Phone/Text/Email    Population Status Unknown    Insurance Medicaid    Insurance/Financial Assistance Referral Financial Counselor;[employer] Patient Asst. Fund    Medication N/A    Medical Provider No    Screening Referrals Made Annual Wellness Visit    Medical Referrals Made [facility] PCP/Clinic    Medical Appointment Completed N/A    CNP Interventions Advocate/Support;Navigate Healthcare System;Case Management    Screenings CN Performed N/A    ED Visit Averted N/A    Life-Saving Intervention Made Yes   prevention of eviction

## 2024-01-07 ENCOUNTER — Ambulatory Visit: Admitting: Family Medicine

## 2024-01-07 VITALS — BP 120/75 | HR 88 | Ht 63.0 in | Wt 109.0 lb

## 2024-01-07 DIAGNOSIS — Z7689 Persons encountering health services in other specified circumstances: Secondary | ICD-10-CM

## 2024-01-07 DIAGNOSIS — R102 Pelvic and perineal pain unspecified side: Secondary | ICD-10-CM

## 2024-01-07 NOTE — Progress Notes (Deleted)
 Established Patient Office Visit  Subjective    Patient ID: Karen Yang, female    DOB: 03-11-1995  Age: 28 y.o. MRN: [id number]  CC:  Chief Complaint  Patient presents with   Establish Care    Pt reports pain in abdomen where she had her c section     HPI Karen Yang presents ***  Outpatient Encounter Medications as of 01/07/2024  Medication Sig   acetaminophen  (TYLENOL ) 500 MG tablet Take 2 tablets (1,000 mg total) by mouth 3 (three) times daily. (Patient not taking: Reported on 01/07/2024)   gabapentin  (NEURONTIN ) 100 MG capsule Take 1 capsule (100 mg total) by mouth 2 (two) times daily. (Patient not taking: Reported on 01/07/2024)   ibuprofen  (ADVIL ) 600 MG tablet Take 1 tablet (600 mg total) by mouth every 6 (six) hours. (Patient not taking: Reported on 01/07/2024)   Prenatal Vit-Fe Fumarate-FA (PRENATAL MULTIVITAMIN) TABS tablet Take 1 tablet by mouth daily at 12 noon. (Patient not taking: Reported on 01/07/2024)   No facility-administered encounter medications on file as of 01/07/2024.    Past Medical History:  Diagnosis Date   Anemia    Asthma    Stress induced   Blood transfusion without reported diagnosis    as an infant   Dysmenorrhea     Past Surgical History:  Procedure Laterality Date   CESAREAN SECTION N/A 07/24/2021   Procedure: CESAREAN SECTION;  Surgeon: Estelle Service, MD;  Location: [facility];  Service: Obstetrics;  Laterality: N/A;   CESAREAN SECTION N/A 07/10/2023   Procedure: CESAREAN DELIVERY;  Surgeon: Claudene Larraine LABOR, DO;  Location: [facility];  Service: Obstetrics;  Laterality: N/A;   wisdom teeth removal      Family History  Problem Relation Age of Onset   Thyroid disease Mother    Ovarian cysts Sister        half sister, aged 81 when cyst was removed    Social History   Socioeconomic History   Marital status: Married    Spouse name: Not on file   Number of children: Not on file   Years of  education: Not on file   Highest education level: Associate degree: academic program  Occupational History   Not on file  Tobacco Use   Smoking status: Never   Smokeless tobacco: Never  Vaping Use   Vaping status: Never Used  Substance and Sexual Activity   Alcohol use: No   Drug use: No   Sexual activity: Yes    Birth control/protection: None  Other Topics Concern   Not on file  Social History Narrative   Not on file   Social Drivers of Health   Tobacco Use: Low Risk (01/07/2024)   Patient History    Smoking Tobacco Use: Never    Smokeless Tobacco Use: Never    Passive Exposure: Not on file  Financial Resource Strain: Low Risk (01/07/2024)   Overall Financial Resource Strain (CARDIA)    Difficulty of Paying Living Expenses: Not very hard  Food Insecurity: No Food Insecurity (01/07/2024)   Epic    Worried About Radiation Protection Practitioner of Food in the Last Year: Never true    Ran Out of Food in the Last Year: Never true  Transportation Needs: No Transportation Needs (01/07/2024)   Epic    Lack of Transportation (Medical): No    Lack of Transportation (Non-Medical): No  Physical Activity: Inactive (01/07/2024)   Exercise Vital Sign    Days of Exercise per  Week: 0 days    Minutes of Exercise per Session: Not on file  Stress: No Stress Concern Present (01/07/2024)   [employer] of Occupational Health - Occupational Stress Questionnaire    Feeling of Stress: Only a little  Social Connections: Socially Integrated (01/07/2024)   Social Connection and Isolation Panel    Frequency of Communication with Friends and Family: More than three times a week    Frequency of Social Gatherings with Friends and Family: Once a week    Attends Religious Services: More than 4 times per year    Active Member of Clubs or Organizations: Yes    Attends Banker Meetings: More than 4 times per year    Marital Status: Married  Catering Manager Violence: Not At Risk (07/09/2023)   Epic     Fear of Current or Ex-Partner: No    Emotionally Abused: No    Physically Abused: No    Sexually Abused: No  Depression (PHQ2-9): Medium Risk (01/07/2024)   Depression (PHQ2-9)    PHQ-2 Score: 5  Alcohol Screen: Low Risk (01/07/2024)   Alcohol Screen    Last Alcohol Screening Score (AUDIT): 1  Housing: Low Risk (01/07/2024)   Epic    Unable to Pay for Housing in the Last Year: No    Number of Times Moved in the Last Year: 0    Homeless in the Last Year: No  Utilities: Not At Risk (07/09/2023)   Epic    Threatened with loss of utilities: No  Health Literacy: Adequate Health Literacy (01/07/2024)   B1300 Health Literacy    Frequency of need for help with medical instructions: Never    ROS      Objective    BP 120/75   Pulse 88   Ht 5' 3 (1.6 m)   Wt 109 lb (49.4 kg)   SpO2 97%   Breastfeeding Yes   BMI 19.31 kg/m   Physical Exam  {Labs (Optional):23779}    Assessment & Plan:   There are no diagnoses linked to this encounter.   No follow-ups on file.   Tanda Raguel SQUIBB, MD

## 2024-01-11 NOTE — Progress Notes (Signed)
 New Patient Office Visit  Subjective    Patient ID: Karen Yang, female    DOB: 1996-01-26  Age: 28 y.o. MRN: [id number]  CC:  Chief Complaint  Patient presents with   Establish Care    Pt reports pain in abdomen where she had her c section     HPI Karen Yang presents to establish care and with complaint of some pelvic pain s/p c-section delivery. Symptoms worsen with nursing..    Outpatient Encounter Medications as of 01/07/2024  Medication Sig   acetaminophen  (TYLENOL ) 500 MG tablet Take 2 tablets (1,000 mg total) by mouth 3 (three) times daily. (Patient not taking: Reported on 01/07/2024)   gabapentin  (NEURONTIN ) 100 MG capsule Take 1 capsule (100 mg total) by mouth 2 (two) times daily. (Patient not taking: Reported on 01/07/2024)   ibuprofen  (ADVIL ) 600 MG tablet Take 1 tablet (600 mg total) by mouth every 6 (six) hours. (Patient not taking: Reported on 01/07/2024)   Prenatal Vit-Fe Fumarate-FA (PRENATAL MULTIVITAMIN) TABS tablet Take 1 tablet by mouth daily at 12 noon. (Patient not taking: Reported on 01/07/2024)   No facility-administered encounter medications on file as of 01/07/2024.    Past Medical History:  Diagnosis Date   Anemia    Asthma    Stress induced   Blood transfusion without reported diagnosis    as an infant   Dysmenorrhea     Past Surgical History:  Procedure Laterality Date   CESAREAN SECTION N/A 07/24/2021   Procedure: CESAREAN SECTION;  Surgeon: Karen Service, MD;  Location: [facility];  Yang: Obstetrics;  Laterality: N/A;   CESAREAN SECTION N/A 07/10/2023   Procedure: CESAREAN DELIVERY;  Surgeon: Karen Larraine LABOR, DO;  Location: [facility];  Yang: Obstetrics;  Laterality: N/A;   wisdom teeth removal      Family History  Problem Relation Age of Onset   Thyroid disease Mother    Ovarian cysts Sister        half sister, aged 83 when cyst was removed    Social History   Socioeconomic History    Marital status: Married    Spouse name: Not on file   Number of children: Not on file   Years of education: Not on file   Highest education level: Associate degree: academic program  Occupational History   Not on file  Tobacco Use   Smoking status: Never   Smokeless tobacco: Never  Vaping Use   Vaping status: Never Used  Substance and Sexual Activity   Alcohol use: No   Drug use: No   Sexual activity: Yes    Birth control/protection: None  Other Topics Concern   Not on file  Social History Narrative   Not on file   Social Drivers of Health   Tobacco Use: Low Risk (01/07/2024)   Patient History    Smoking Tobacco Use: Never    Smokeless Tobacco Use: Never    Passive Exposure: Not on file  Financial Resource Strain: Low Risk (01/07/2024)   Overall Financial Resource Strain (CARDIA)    Difficulty of Paying Living Expenses: Not very hard  Food Insecurity: No Food Insecurity (01/07/2024)   Epic    Worried About Radiation Protection Practitioner of Food in the Last Year: Never true    Ran Out of Food in the Last Year: Never true  Transportation Needs: No Transportation Needs (01/07/2024)   Epic    Lack of Transportation (Medical): No    Lack of Transportation (Non-Medical):  No  Physical Activity: Inactive (01/07/2024)   Exercise Vital Sign    Days of Exercise per Week: 0 days    Minutes of Exercise per Session: Not on file  Stress: No Stress Concern Present (01/07/2024)   [employer] of Occupational Health - Occupational Stress Questionnaire    Feeling of Stress: Only a little  Social Connections: Socially Integrated (01/07/2024)   Social Connection and Isolation Panel    Frequency of Communication with Friends and Family: More than three times a week    Frequency of Social Gatherings with Friends and Family: Once a week    Attends Religious Services: More than 4 times per year    Active Member of Clubs or Organizations: Yes    Attends Banker Meetings: More than 4  times per year    Marital Status: Married  Catering Manager Violence: Not At Risk (07/09/2023)   Epic    Fear of Current or Ex-Partner: No    Emotionally Abused: No    Physically Abused: No    Sexually Abused: No  Depression (PHQ2-9): Medium Risk (01/07/2024)   Depression (PHQ2-9)    PHQ-2 Score: 5  Alcohol Screen: Low Risk (01/07/2024)   Alcohol Screen    Last Alcohol Screening Score (AUDIT): 1  Housing: Low Risk (01/07/2024)   Epic    Unable to Pay for Housing in the Last Year: No    Number of Times Moved in the Last Year: 0    Homeless in the Last Year: No  Utilities: Not At Risk (07/09/2023)   Epic    Threatened with loss of utilities: No  Health Literacy: Adequate Health Literacy (01/07/2024)   B1300 Health Literacy    Frequency of need for help with medical instructions: Never    Review of Systems  All other systems reviewed and are negative.       Objective   BP 120/75   Pulse 88   Ht 5' 3 (1.6 m)   Wt 109 lb (49.4 kg)   SpO2 97%   Breastfeeding Yes   BMI 19.31 kg/m   Physical Exam Vitals and nursing note reviewed.  Constitutional:      General: She is not in acute distress. Cardiovascular:     Rate and Rhythm: Normal rate and regular rhythm.  Pulmonary:     Effort: Pulmonary effort is normal.     Breath sounds: Normal breath sounds.  Abdominal:     Palpations: Abdomen is soft.     Tenderness: There is no abdominal tenderness.  Neurological:     General: No focal deficit present.     Mental Status: She is alert and oriented to person, place, and time.         Assessment & Plan:   Pelvic pain  Encounter to establish care   Patient to follow up with OB/GYN for further eval/mgt  Return in about 6 months (around 07/07/2024) for physical.   Karen Raguel SQUIBB, MD

## 2024-01-26 ENCOUNTER — Telehealth: Admitting: Physician Assistant

## 2024-01-26 DIAGNOSIS — R6889 Other general symptoms and signs: Secondary | ICD-10-CM

## 2024-01-26 DIAGNOSIS — Z20828 Contact with and (suspected) exposure to other viral communicable diseases: Secondary | ICD-10-CM | POA: Diagnosis not present

## 2024-01-26 MED ORDER — LIDOCAINE VISCOUS HCL 2 % MT SOLN: 5.0000 mL | Freq: Four times a day (QID) | OROMUCOSAL | 0 refills | Status: AC | PRN

## 2024-01-26 MED ORDER — BENZONATATE 100 MG PO CAPS: 100.0000 mg | ORAL_CAPSULE | Freq: Three times a day (TID) | ORAL | 0 refills | Status: DC | PRN

## 2024-01-26 MED ORDER — NAPROXEN 500 MG PO TABS: 500.0000 mg | ORAL_TABLET | Freq: Two times a day (BID) | ORAL | 0 refills | Status: AC

## 2024-01-26 NOTE — Progress Notes (Signed)
 E visit for Flu like symptoms   We are sorry that you are not feeling well.  Here is how we plan to help! Based on what you have shared with me it looks like you may have flu-like symptoms that should be watched but do not seem to indicate anti-viral treatment.  Influenza or the flu is  an infection caused by a respiratory virus. The flu virus is highly contagious and persons who did not receive their yearly flu vaccination may catch the flu from close contact.  We have anti-viral medications to treat the viruses that cause this infection. They are not a cure and only shorten the course of the infection. These prescriptions are most effective when they are given within the first 2 days of flu symptoms.   For nasal congestion, you may use an oral decongestant such as Mucinex D or if you have glaucoma or high blood pressure use plain Mucinex.  Saline nasal spray or nasal drops can help and can safely be used as often as needed for congestion.  If you have a sore or scratchy throat, use a saltwater gargle-  to  teaspoon of salt dissolved in a 4-ounce to 8-ounce glass of warm water .  Gargle the solution for approximately 15-30 seconds and then spit.  It is important not to swallow the solution.  You can also use throat lozenges/cough drops and Chloraseptic spray to help with throat pain or discomfort.  Warm or cold liquids can also be helpful in relieving throat pain.  For headache, pain or general discomfort, you can use Ibuprofen  or Tylenol  as directed.   Some authorities believe that zinc sprays or the use of Echinacea may shorten the course of your symptoms.  I have prescribed the following medications to help lessen symptoms: I have prescribed an anti-inflammatory - Naprosyn  500 mg. Take twice daily as needed for fever or body aches for 2 weeks, and Viscous Lidocaine  2% Swallow 5-10 mL every 4-6 hours as needed for sore throat.  You are to isolate at home until you have been fever-free  for at least 24 hours without a fever-reducing medication, and symptoms have been steadily improving for 24 hours.  If you must be around other household members who do not have symptoms, you need to make sure that both you and the family members are masking consistently with a high-quality mask.  If you note any worsening of symptoms despite treatment, please seek an in-person evaluation ASAP. If you note any significant shortness of breath or any chest pain, please seek ED evaluation. Please do not delay care!  ANYONE WHO HAS FLU SYMPTOMS SHOULD: Stay home. The flu is highly contagious and going out or to work exposes others! Be sure to drink plenty of fluids. Water  is fine as well as fruit juices, sodas and electrolyte beverages. You may want to stay away from caffeine  or alcohol. If you are nauseated, try taking small sips of liquids. How do you know if you are getting enough fluid? Your urine should be a pale yellow or almost colorless. Get rest. Taking a steamy shower or using a humidifier may help nasal congestion and ease sore throat pain. Using a saline nasal spray works much the same way. Cough drops, hard candies and sore throat lozenges may ease your cough. Line up a caregiver. Have someone check on you regularly.  GET HELP RIGHT AWAY IF: You cannot keep down liquids or your medications. You become short of breath Your fell like you are  going to pass out or loose consciousness. Your symptoms persist after you have completed your treatment plan  MAKE SURE YOU  Understand these instructions. Will watch your condition. Will get help right away if you are not doing well or get worse.  Your e-visit answers were reviewed by a board certified advanced clinical practitioner to complete your personal care plan.  Depending on the condition, your plan could have included both over the counter or prescription medications.  If there is a problem please reply  once you have received a  response from your provider.  Your safety is important to us .  If you have drug allergies check your prescription carefully.    You can use MyChart to ask questions about todays visit, request a non-urgent call back, or ask for a work or school excuse for 24 hours related to this e-Visit. If it has been greater than 24 hours you will need to follow up with your provider, or enter a new e-Visit to address those concerns.  You will get an e-mail in the next two days asking about your experience.  I hope that your e-visit has been valuable and will speed your recovery. Thank you for using e-visits.   I have spent 5 minutes in review of e-visit questionnaire, review and updating patient chart, medical decision making and response to patient.   Delon CHRISTELLA Dickinson, PA-C

## 2024-07-10 ENCOUNTER — Encounter: Payer: Self-pay | Admitting: Family Medicine
